# Patient Record
Sex: Male | Born: 1970 | Race: White | Hispanic: No | Marital: Married | Smoking: Current every day smoker
Health system: Southern US, Community
[De-identification: ages and names within clinical notes are randomized; demographics above are authoritative.]

## PROBLEM LIST (undated history)

## (undated) DIAGNOSIS — F329 Major depressive disorder, single episode, unspecified: Secondary | ICD-10-CM

## (undated) DIAGNOSIS — F419 Anxiety disorder, unspecified: Secondary | ICD-10-CM

## (undated) DIAGNOSIS — M549 Dorsalgia, unspecified: Secondary | ICD-10-CM

## (undated) DIAGNOSIS — F32A Depression, unspecified: Secondary | ICD-10-CM

## (undated) DIAGNOSIS — K219 Gastro-esophageal reflux disease without esophagitis: Secondary | ICD-10-CM

## (undated) HISTORY — DX: Gastro-esophageal reflux disease without esophagitis: K21.9

## (undated) HISTORY — DX: Major depressive disorder, single episode, unspecified: F32.9

## (undated) HISTORY — DX: Depression, unspecified: F32.A

## (undated) HISTORY — DX: Anxiety disorder, unspecified: F41.9

---

## 2007-11-07 ENCOUNTER — Emergency Department (HOSPITAL_COMMUNITY): Admission: EM | Admit: 2007-11-07 | Discharge: 2007-11-07 | Payer: Self-pay | Admitting: Emergency Medicine

## 2015-06-21 ENCOUNTER — Other Ambulatory Visit: Payer: Self-pay | Admitting: Physician Assistant

## 2015-06-21 LAB — CBC WITH DIFFERENTIAL/PLATELET
BASOS ABS: 0 10*3/uL (ref 0.0–0.1)
BASOS PCT: 0 % (ref 0–1)
EOS ABS: 0.3 10*3/uL (ref 0.0–0.7)
EOS PCT: 4 % (ref 0–5)
HCT: 43.5 % (ref 39.0–52.0)
Hemoglobin: 15.4 g/dL (ref 13.0–17.0)
LYMPHS ABS: 2.5 10*3/uL (ref 0.7–4.0)
Lymphocytes Relative: 36 % (ref 12–46)
MCH: 32.6 pg (ref 26.0–34.0)
MCHC: 35.4 g/dL (ref 30.0–36.0)
MCV: 92.2 fL (ref 78.0–100.0)
MPV: 11 fL (ref 8.6–12.4)
Monocytes Absolute: 0.6 10*3/uL (ref 0.1–1.0)
Monocytes Relative: 9 % (ref 3–12)
Neutro Abs: 3.6 10*3/uL (ref 1.7–7.7)
Neutrophils Relative %: 51 % (ref 43–77)
PLATELETS: 172 10*3/uL (ref 150–400)
RBC: 4.72 MIL/uL (ref 4.22–5.81)
RDW: 14.2 % (ref 11.5–15.5)
WBC: 7 10*3/uL (ref 4.0–10.5)

## 2015-06-21 LAB — LIPID PANEL
CHOLESTEROL: 220 mg/dL — AB (ref 125–200)
HDL: 32 mg/dL — AB (ref >=40)
LDL CALC: 134 mg/dL — AB (ref <130)
TRIGLYCERIDES: 269 mg/dL — AB (ref <150)
Total CHOL/HDL Ratio: 6.9 Ratio — ABNORMAL HIGH (ref <=5.0)
VLDL: 54 mg/dL — ABNORMAL HIGH (ref <30)

## 2015-06-21 LAB — COMPREHENSIVE METABOLIC PANEL
ALBUMIN: 4.7 g/dL (ref 3.6–5.1)
ALT: 28 U/L (ref 9–46)
AST: 20 U/L (ref 10–40)
Alkaline Phosphatase: 54 U/L (ref 40–115)
BILIRUBIN TOTAL: 0.6 mg/dL (ref 0.2–1.2)
BUN: 11 mg/dL (ref 7–25)
CALCIUM: 9.4 mg/dL (ref 8.6–10.3)
CO2: 29 mmol/L (ref 20–31)
CREATININE: 1.16 mg/dL (ref 0.60–1.35)
Chloride: 104 mmol/L (ref 98–110)
Glucose, Bld: 89 mg/dL (ref 65–99)
Potassium: 4.5 mmol/L (ref 3.5–5.3)
Sodium: 140 mmol/L (ref 135–146)
TOTAL PROTEIN: 7 g/dL (ref 6.1–8.1)

## 2015-06-21 LAB — HEMOGLOBIN A1C
HEMOGLOBIN A1C: 5.9 % — AB (ref <5.7)
MEAN PLASMA GLUCOSE: 123 mg/dL — AB (ref <117)

## 2015-06-21 LAB — TSH: TSH: 4.534 u[IU]/mL — AB (ref 0.350–4.500)

## 2015-06-21 LAB — AMYLASE: Amylase: 21 U/L (ref 0–105)

## 2015-06-21 LAB — LIPASE: Lipase: 13 U/L (ref 7–60)

## 2015-07-17 ENCOUNTER — Ambulatory Visit: Payer: Self-pay | Admitting: Physician Assistant

## 2015-07-17 ENCOUNTER — Encounter: Payer: Self-pay | Admitting: Physician Assistant

## 2015-07-17 VITALS — BP 106/74 | HR 66 | Temp 99.1°F | Ht 73.25 in | Wt 202.0 lb

## 2015-07-17 DIAGNOSIS — R1084 Generalized abdominal pain: Secondary | ICD-10-CM

## 2015-07-17 DIAGNOSIS — E785 Hyperlipidemia, unspecified: Secondary | ICD-10-CM

## 2015-07-17 MED ORDER — LOVASTATIN 20 MG PO TABS: 20.0000 mg | ORAL_TABLET | Freq: Every day | ORAL | Status: DC

## 2015-07-17 NOTE — Patient Instructions (Addendum)
Fat and Cholesterol Restricted Diet High levels of fat and cholesterol in your blood may lead to various health problems, such as diseases of the heart, blood vessels, gallbladder, liver, and pancreas. Fats are concentrated sources of energy that come in various forms. Certain types of fat, including saturated fat, may be harmful in excess. Cholesterol is a substance needed by your body in small amounts. Your body makes all the cholesterol it needs. Excess cholesterol comes from the food you eat. When you have high levels of cholesterol and saturated fat in your blood, health problems can develop because the excess fat and cholesterol will gather along the walls of your blood vessels, causing them to narrow. Choosing the right foods will help you control your intake of fat and cholesterol. This will help keep the levels of these substances in your blood within normal limits and reduce your risk of disease. WHAT IS MY PLAN? Your health care provider recommends that you:  Get no more than __________ % of the total calories in your daily diet from fat.  Limit your intake of saturated fat to less than ______% of your total calories each day.  Limit the amount of cholesterol in your diet to less than _________mg per day. WHAT TYPES OF FAT SHOULD I CHOOSE?  Choose healthy fats more often. Choose monounsaturated and polyunsaturated fats, such as olive and canola oil, flaxseeds, walnuts, almonds, and seeds.  Eat more omega-3 fats. Good choices include salmon, mackerel, sardines, tuna, flaxseed oil, and ground flaxseeds. Aim to eat fish at least two times a week.  Limit saturated fats. Saturated fats are primarily found in animal products, such as meats, butter, and cream. Plant sources of saturated fats include palm oil, palm kernel oil, and coconut oil.  Avoid foods with partially hydrogenated oils in them. These contain trans fats. Examples of foods that contain trans fats are stick margarine, some tub  margarines, cookies, crackers, and other baked goods. WHAT GENERAL GUIDELINES DO I NEED TO FOLLOW? These guidelines for healthy eating will help you control your intake of fat and cholesterol:  Check food labels carefully to identify foods with trans fats or high amounts of saturated fat.  Fill one half of your plate with vegetables and green salads.  Fill one fourth of your plate with whole grains. Look for the word "whole" as the first word in the ingredient list.  Fill one fourth of your plate with lean protein foods.  Limit fruit to two servings a day. Choose fruit instead of juice.  Eat more foods that contain soluble fiber. Examples of foods that contain this type of fiber are apples, broccoli, carrots, beans, peas, and barley. Aim to get 20-30 g of fiber per day.  Eat more home-cooked food and less restaurant, buffet, and fast food.  Limit or avoid alcohol.  Limit foods high in starch and sugar.  Limit fried foods.  Cook foods using methods other than frying. Baking, boiling, grilling, and broiling are all great options.  Lose weight if you are overweight. Losing just 5-10% of your initial body weight can help your overall health and prevent diseases such as diabetes and heart disease. WHAT FOODS CAN I EAT? Grains Whole grains, such as whole wheat or whole grain breads, crackers, cereals, and pasta. Unsweetened oatmeal, bulgur, barley, quinoa, or brown rice. Corn or whole wheat flour tortillas. Vegetables Fresh or frozen vegetables (raw, steamed, roasted, or grilled). Green salads. Fruits All fresh, canned (in natural juice), or frozen fruits. Meat and  Other Protein Products Ground beef (85% or leaner), grass-fed beef, or beef trimmed of fat. Skinless chicken or [place]. Ground chicken or [place]. Pork trimmed of fat. All fish and seafood. Eggs. Dried beans, peas, or lentils. Unsalted nuts or seeds. Unsalted canned or dry beans. Dairy Low-fat dairy products, such as skim or  1% milk, 2% or reduced-fat cheeses, low-fat ricotta or cottage cheese, or plain low-fat yogurt. Fats and Oils Tub margarines without trans fats. Light or reduced-fat mayonnaise and salad dressings. Avocado. Olive, canola, sesame, or safflower oils. Natural peanut or almond butter (choose ones without added sugar and oil). The items listed above may not be a complete list of recommended foods or beverages. Contact your dietitian for more options. WHAT FOODS ARE NOT RECOMMENDED? Grains White bread. White pasta. White rice. Cornbread. Bagels, pastries, and croissants. Crackers that contain trans fat. Vegetables White potatoes. Corn. Creamed or fried vegetables. Vegetables in a cheese sauce. Fruits Dried fruits. Canned fruit in light or heavy syrup. Fruit juice. Meat and Other Protein Products Fatty cuts of meat. Ribs, chicken wings, bacon, sausage, bologna, salami, chitterlings, fatback, hot dogs, bratwurst, and packaged luncheon meats. Liver and organ meats. Dairy Whole or 2% milk, cream, half-and-half, and cream cheese. Whole milk cheeses. Whole-fat or sweetened yogurt. Full-fat cheeses. Nondairy creamers and whipped toppings. Processed cheese, cheese spreads, or cheese curds. Sweets and Desserts Corn syrup, sugars, honey, and molasses. Candy. Jam and jelly. Syrup. Sweetened cereals. Cookies, pies, cakes, donuts, muffins, and ice cream. Fats and Oils Butter, stick margarine, lard, shortening, ghee, or bacon fat. Coconut, palm kernel, or palm oils. Beverages Alcohol. Sweetened drinks (such as sodas, lemonade, and fruit drinks or punches). The items listed above may not be a complete list of foods and beverages to avoid. Contact your dietitian for more information.   This information is not intended to replace advice given to you by your health care provider. Make sure you discuss any questions you have with your health care provider.   Document Released: 09/07/2005 Document Revised: 09/28/2014  Document Reviewed: 12/06/2013 Elsevier Interactive Patient Education 2016 [employer] Constipation, Adult Constipation is when a person has fewer than three bowel movements a week, has difficulty having a bowel movement, or has stools that are dry, hard, or larger than normal. As people grow older, constipation is more common. A low-fiber diet, not taking in enough fluids, and taking certain medicines may make constipation worse.  CAUSES   Certain medicines, such as antidepressants, pain medicine, iron supplements, antacids, and water pills.   Certain diseases, such as diabetes, irritable bowel syndrome (IBS), thyroid disease, or depression.   Not drinking enough water.   Not eating enough fiber-rich foods.   Stress or travel.   Lack of physical activity or exercise.   Ignoring the urge to have a bowel movement.   Using laxatives too much.  SIGNS AND SYMPTOMS   Having fewer than three bowel movements a week.   Straining to have a bowel movement.   Having stools that are hard, dry, or larger than normal.   Feeling full or bloated.   Pain in the lower abdomen.   Not feeling relief after having a bowel movement.  DIAGNOSIS  Your health care provider will take a medical history and perform a physical exam. Further testing may be done for severe constipation. Some tests may include:  A barium enema X-ray to examine your rectum, colon, and, sometimes, your small intestine.   A sigmoidoscopy to examine your lower colon.  A colonoscopy to examine your entire colon. TREATMENT  Treatment will depend on the severity of your constipation and what is causing it. Some dietary treatments include drinking more fluids and eating more fiber-rich foods. Lifestyle treatments may include regular exercise. If these diet and lifestyle recommendations do not help, your health care provider may recommend taking over-the-counter laxative medicines to help you have bowel  movements. Prescription medicines may be prescribed if over-the-counter medicines do not work.  HOME CARE INSTRUCTIONS   Eat foods that have a lot of fiber, such as fruits, vegetables, whole grains, and beans.  Limit foods high in fat and processed sugars, such as french fries, hamburgers, cookies, candies, and soda.   A fiber supplement may be added to your diet if you cannot get enough fiber from foods.   Drink enough fluids to keep your urine clear or pale yellow.   Exercise regularly or as directed by your health care provider.   Go to the restroom when you have the urge to go. Do not hold it.   Only take over-the-counter or prescription medicines as directed by your health care provider. Do not take other medicines for constipation without talking to your health care provider first.  SEEK IMMEDIATE MEDICAL CARE IF:   You have bright red blood in your stool.   Your constipation lasts for more than 4 days or gets worse.   You have abdominal or rectal pain.   You have thin, pencil-like stools.   You have unexplained weight loss. MAKE SURE YOU:   Understand these instructions.  Will watch your condition.  Will get help right away if you are not doing well or get worse.   This information is not intended to replace advice given to you by your health care provider. Make sure you discuss any questions you have with your health care provider.   Document Released: 06/05/2004 Document Revised: 09/28/2014 Document Reviewed: 06/19/2013 Elsevier Interactive Patient Education [employer]

## 2015-07-17 NOTE — Progress Notes (Signed)
Ht 6' 1.25" (1.861 m)  Wt 202 lb (91.627 kg)  BMI 26.46 kg/m2   Subjective:    Patient ID: Jonathan Booth, male    DOB: [birth year], 44 y.o.   MRN: [id number]  HPI: Jonathan Booth is a 44 y.o. male presenting on 07/17/2015 for Abdominal Pain   HPI   Pt poor historian.  States abd pain for over a year.  States somewhat improved since starting protonix but still hurts. States BM usually long time in between- he says sometimes a month.  His last BM was 3 days ago.  Pt is taking a med for depression but he doesn't know what it is called. He [facility] to [facility].     Relevant past medical, surgical, family and social history reviewed and updated as indicated. Interim medical history since our last visit reviewed. Allergies and medications reviewed and updated.   Current outpatient prescriptions:  .  pantoprazole (PROTONIX) 40 MG tablet, Take 40 mg by mouth 2 (two) times daily., Disp: , Rfl:  .  lovastatin (MEVACOR) 20 MG tablet, Take 1 tablet (20 mg total) by mouth at bedtime., Disp: 30 tablet, Rfl: 3   Review of Systems  Constitutional: Positive for diaphoresis. Negative for fever, chills, appetite change, fatigue and unexpected weight change.  HENT: Positive for dental problem. Negative for congestion, drooling, ear pain, facial swelling, hearing loss, mouth sores, sneezing, sore throat, trouble swallowing and voice change.   Eyes: Negative for pain, discharge, redness, itching and visual disturbance.  Respiratory: Positive for shortness of breath and wheezing. Negative for cough and choking.   Cardiovascular: Positive for chest pain. Negative for palpitations and leg swelling.  Gastrointestinal: Positive for abdominal pain. Negative for vomiting, diarrhea, constipation and blood in stool.  Endocrine: Positive for polydipsia. Negative for cold intolerance and heat intolerance.  Genitourinary: Negative for dysuria, hematuria and decreased urine volume.  Musculoskeletal: Positive  for back pain, arthralgias and gait problem.  Skin: Negative for rash.  Allergic/Immunologic: Negative for environmental allergies.  Neurological: Positive for light-headedness. Negative for seizures, syncope and headaches.  Hematological: Negative for adenopathy.  Psychiatric/Behavioral: Positive for dysphoric mood. Negative for suicidal ideas and agitation. The patient is nervous/anxious.     Per HPI unless specifically indicated above     Objective:    Ht 6' 1.25" (1.861 m)  Wt 202 lb (91.627 kg)  BMI 26.46 kg/m2  Wt Readings from Last 3 Encounters:  07/17/15 202 lb (91.627 kg)    Physical Exam  Results for orders placed or performed in visit on 06/21/15  CBC with Differential/Platelet  Result Value Ref Range   WBC 7.0 4.0 - 10.5 K/uL   RBC 4.72 4.22 - 5.81 MIL/uL   Hemoglobin 15.4 13.0 - 17.0 g/dL   HCT 43.5 39.0 - 52.0 %   MCV 92.2 78.0 - 100.0 fL   MCH 32.6 26.0 - 34.0 pg   MCHC 35.4 30.0 - 36.0 g/dL   RDW 14.2 11.5 - 15.5 %   Platelets 172 150 - 400 K/uL   MPV 11.0 8.6 - 12.4 fL   Neutrophils Relative % 51 43 - 77 %   Neutro Abs 3.6 1.7 - 7.7 K/uL   Lymphocytes Relative 36 12 - 46 %   Lymphs Abs 2.5 0.7 - 4.0 K/uL   Monocytes Relative 9 3 - 12 %   Monocytes Absolute 0.6 0.1 - 1.0 K/uL   Eosinophils Relative 4 0 - 5 %   Eosinophils Absolute 0.3 0.0 -  0.7 K/uL   Basophils Relative 0 0 - 1 %   Basophils Absolute 0.0 0.0 - 0.1 K/uL   Smear Review Criteria for review not met   Comprehensive metabolic panel  Result Value Ref Range   Sodium 140 135 - 146 mmol/L   Potassium 4.5 3.5 - 5.3 mmol/L   Chloride 104 98 - 110 mmol/L   CO2 29 20 - 31 mmol/L   Glucose, Bld 89 65 - 99 mg/dL   BUN 11 7 - 25 mg/dL   Creat 1.16 0.60 - 1.35 mg/dL   Total Bilirubin 0.6 0.2 - 1.2 mg/dL   Alkaline Phosphatase 54 40 - 115 U/L   AST 20 10 - 40 U/L   ALT 28 9 - 46 U/L   Total Protein 7.0 6.1 - 8.1 g/dL   Albumin 4.7 3.6 - 5.1 g/dL   Calcium 9.4 8.6 - 10.3 mg/dL  Lipid panel   Result Value Ref Range   Cholesterol 220 (H) 125 - 200 mg/dL   Triglycerides 269 (H) <150 mg/dL   HDL 32 (L) >=40 mg/dL   Total CHOL/HDL Ratio 6.9 (H) <=5.0 Ratio   VLDL 54 (H) <30 mg/dL   LDL Cholesterol 134 (H) <130 mg/dL  Amylase  Result Value Ref Range   Amylase 21 0 - 105 U/L  Lipase  Result Value Ref Range   Lipase 13 7 - 60 U/L  TSH  Result Value Ref Range   TSH 4.534 (H) 0.350 - 4.500 uIU/mL  Hemoglobin A1c  Result Value Ref Range   Hgb A1c MFr Bld 5.9 (H) <5.7 %   Mean Plasma Glucose 123 (H) <117 mg/dL     fecal occult blood NEGATIVE   Assessment & Plan:   Encounter Diagnoses  Name Primary?  . Generalized abdominal pain Yes  . Hyperlipemia     -Reviewed labs with pt  -rx lovastatin for chol. Gave lowfat diet. -Ct abd for pain. Pt given [facility] discount application -Cont protonix. Gave handout on constipation and counseled to increase water and add fiber -F/u 1 mo. Pt reminded to bring all meds to every appt

## 2015-08-01 ENCOUNTER — Telehealth: Payer: Self-pay | Admitting: Student

## 2015-08-01 NOTE — Telephone Encounter (Signed)
Called pt to notify him of CT appt @ [facility]. Pt was informed to pick up contrast no later than Friday 08-02-15. Pt undersood.

## 2015-08-05 ENCOUNTER — Ambulatory Visit (HOSPITAL_COMMUNITY)
Admission: RE | Admit: 2015-08-05 | Discharge: 2015-08-05 | Disposition: A | Discharge status: Home / Self Care | Payer: Self-pay | Source: Ambulatory Visit | Attending: Physician Assistant | Admitting: Physician Assistant

## 2015-08-05 DIAGNOSIS — G8929 Other chronic pain: Secondary | ICD-10-CM | POA: Insufficient documentation

## 2015-08-05 DIAGNOSIS — R1011 Right upper quadrant pain: Secondary | ICD-10-CM | POA: Insufficient documentation

## 2015-08-05 MED ORDER — IOHEXOL 300 MG/ML  SOLN
100.0000 mL | Freq: Once | INTRAMUSCULAR | Status: AC | PRN
Start: 2015-08-05 — End: 2015-08-05
  Administered 2015-08-05: 100 mL via INTRAVENOUS

## 2015-08-19 ENCOUNTER — Ambulatory Visit: Payer: Self-pay | Admitting: Physician Assistant

## 2015-08-19 ENCOUNTER — Encounter: Payer: Self-pay | Admitting: Physician Assistant

## 2015-08-19 VITALS — BP 104/74 | HR 63 | Temp 98.1°F | Ht 73.25 in | Wt 202.5 lb

## 2015-08-19 DIAGNOSIS — F17210 Nicotine dependence, cigarettes, uncomplicated: Secondary | ICD-10-CM

## 2015-08-19 DIAGNOSIS — K219 Gastro-esophageal reflux disease without esophagitis: Secondary | ICD-10-CM | POA: Insufficient documentation

## 2015-08-19 MED ORDER — PANTOPRAZOLE SODIUM 40 MG PO TBEC: 40.0000 mg | DELAYED_RELEASE_TABLET | Freq: Every day | ORAL | Status: DC

## 2015-08-19 NOTE — Progress Notes (Signed)
BP 104/74 mmHg  Pulse 63  Temp(Src) 98.1 F (36.7 C)  Ht 6' 1.25" (1.861 m)  Wt 202 lb 8 oz (91.853 kg)  BMI 26.52 kg/m2  SpO2 99%   Subjective:    Patient ID: Jonathan Booth, male    DOB: [birth year], 44 y.o.   MRN: [id number]  HPI: Jonathan Booth is a 44 y.o. male presenting on 08/19/2015 for Abdominal Pain   HPI  Chief Complaint  Patient presents with  . Abdominal Pain    pt states he feels medicine is helping, but he is still hurting on the left part of his ribs. [facility] and wants to know the results.    Pt was only taking his protonix 1 qod instead of bid as prescribed. Pt was given coupon so rx of 60 tabs would run $14.  He continues to buy his cigarettes.  Pt states that his abd pain feels better when he is taking his medication.  Reviewed CT abd pelvis with pt- normal  Pt is continuing with [facility] for MH issues  Relevant past medical, surgical, family and social history reviewed and updated as indicated. Interim medical history since our last visit reviewed. Allergies and medications reviewed and updated.   Current outpatient prescriptions:  .  DULoxetine (CYMBALTA) 60 MG capsule, Take 60 mg by mouth daily., Disp: , Rfl:  .  lovastatin (MEVACOR) 20 MG tablet, Take 1 tablet (20 mg total) by mouth at bedtime., Disp: 30 tablet, Rfl: 3 .  pantoprazole (PROTONIX) 40 MG tablet, Take 40 mg by mouth 2 (two) times daily., Disp: , Rfl: - only taking qod   Review of Systems  Constitutional: Negative for fever, chills, diaphoresis, appetite change, fatigue and unexpected weight change.  HENT: Positive for dental problem. Negative for congestion, drooling, ear pain, facial swelling, hearing loss, mouth sores, sneezing, sore throat, trouble swallowing and voice change.   Eyes: Negative for pain, discharge, redness, itching and visual disturbance.  Respiratory: Positive for shortness of breath and wheezing. Negative for cough and choking.   Cardiovascular:  Negative for chest pain, palpitations and leg swelling.  Gastrointestinal: Positive for abdominal pain. Negative for vomiting, diarrhea, constipation and blood in stool.  Endocrine: Negative for cold intolerance, heat intolerance and polydipsia.  Genitourinary: Negative for dysuria, hematuria and decreased urine volume.  Musculoskeletal: Positive for back pain, arthralgias and gait problem.  Skin: Negative for rash.  Allergic/Immunologic: Negative for environmental allergies.  Neurological: Negative for seizures, syncope, light-headedness and headaches.  Hematological: Negative for adenopathy.  Psychiatric/Behavioral: Positive for dysphoric mood and agitation. Negative for suicidal ideas. The patient is nervous/anxious.     Per HPI unless specifically indicated above     Objective:    BP 104/74 mmHg  Pulse 63  Temp(Src) 98.1 F (36.7 C)  Ht 6' 1.25" (1.861 m)  Wt 202 lb 8 oz (91.853 kg)  BMI 26.52 kg/m2  SpO2 99%  Wt Readings from Last 3 Encounters:  08/19/15 202 lb 8 oz (91.853 kg)  07/17/15 202 lb (91.627 kg)    Physical Exam  Constitutional: He is oriented to person, place, and time. He appears well-developed and well-nourished.  HENT:  Head: Normocephalic and atraumatic.  Neck: Neck supple.  Cardiovascular: Normal rate and regular rhythm.   Pulmonary/Chest: Effort normal and breath sounds normal. He has no wheezes.  Abdominal: Soft. Bowel sounds are normal. He exhibits no distension and no mass. There is no hepatosplenomegaly. There is tenderness in the epigastric  area. There is no rebound, no guarding and no CVA tenderness.  Musculoskeletal: He exhibits no edema.  Lymphadenopathy:    He has no cervical adenopathy.  Neurological: He is alert and oriented to person, place, and time.  Skin: Skin is warm and dry.  Psychiatric: He has a normal mood and affect. His behavior is normal.  Vitals reviewed.       Assessment & Plan:   Encounter Diagnoses  Name Primary?  .  Gastroesophageal reflux disease, esophagitis presence not specified Yes  . Cigarette nicotine dependence, uncomplicated    Discussed with pt that we can refer him to GI if his abd pain fails to resolve, but that we have to try to improve his condition first and this requires that he take his medication.  Discussed with pt that he needs to consider how to best use his limited funds (buying his meds versus his cigarettes).  Reviewed with him that smoking worsens GERD by relaxing the sphincter muscle between the esophagus and the stomach.  Pt states he will take his protonix as prescribed.  Will f/u 1 mo to check his symptoms. Counseled on smoking cessation

## 2015-08-19 NOTE — Patient Instructions (Signed)
Smoking Cessation, Tips for Success If you are ready to quit smoking, congratulations! You have chosen to help yourself be healthier. Cigarettes bring nicotine, tar, carbon monoxide, and other irritants into your body. Your lungs, heart, and blood vessels will be able to work better without these poisons. There are many different ways to quit smoking. Nicotine gum, nicotine patches, a nicotine inhaler, or nicotine nasal spray can help with physical craving. Hypnosis, support groups, and medicines help break the habit of smoking. WHAT THINGS CAN I DO TO MAKE QUITTING EASIER?  Here are some tips to help you quit for good:  Pick a date when you will quit smoking completely. Tell all of your friends and family about your plan to quit on that date.  Do not try to slowly cut down on the number of cigarettes you are smoking. Pick a quit date and quit smoking completely starting on that day.  Throw away all cigarettes.   Clean and remove all ashtrays from your home, work, and car.  On a card, write down your reasons for quitting. Carry the card with you and read it when you get the urge to smoke.  Cleanse your body of nicotine. Drink enough water and fluids to keep your urine clear or pale yellow. Do this after quitting to flush the nicotine from your body.  Learn to predict your moods. Do not let a bad situation be your excuse to have a cigarette. Some situations in your life might tempt you into wanting a cigarette.  Never have "just one" cigarette. It leads to wanting another and another. Remind yourself of your decision to quit.  Change habits associated with smoking. If you smoked while driving or when feeling stressed, try other activities to replace smoking. Stand up when drinking your coffee. Brush your teeth after eating. Sit in a different chair when you read the paper. Avoid alcohol while trying to quit, and try to drink fewer caffeinated beverages. Alcohol and caffeine may urge you to  smoke.  Avoid foods and drinks that can trigger a desire to smoke, such as sugary or spicy foods and alcohol.  Ask people who smoke not to smoke around you.  Have something planned to do right after eating or having a cup of coffee. For example, plan to take a walk or exercise.  Try a relaxation exercise to calm you down and decrease your stress. Remember, you may be tense and nervous for the first 2 weeks after you quit, but this will pass.  Find new activities to keep your hands busy. Play with a pen, coin, or rubber band. Doodle or draw things on paper.  Brush your teeth right after eating. This will help cut down on the craving for the taste of tobacco after meals. You can also try mouthwash.   Use oral substitutes in place of cigarettes. Try using lemon drops, carrots, cinnamon sticks, or chewing gum. Keep them handy so they are available when you have the urge to smoke.  When you have the urge to smoke, try deep breathing.  Designate your home as a nonsmoking area.  If you are a heavy smoker, ask your health care provider about a prescription for nicotine chewing gum. It can ease your withdrawal from nicotine.  Reward yourself. Set aside the cigarette money you save and buy yourself something nice.  Look for support from others. Join a support group or smoking cessation program. Ask someone at home or at work to help you with your plan   to quit smoking.  Always ask yourself, "Do I need this cigarette or is this just a reflex?" Tell yourself, "Today, I choose not to smoke," or "I do not want to smoke." You are reminding yourself of your decision to quit.  Do not replace cigarette smoking with electronic cigarettes (commonly called e-cigarettes). The safety of e-cigarettes is unknown, and some may contain harmful chemicals.  If you relapse, do not give up! Plan ahead and think about what you will do the next time you get the urge to smoke. HOW WILL I FEEL WHEN I QUIT SMOKING? You  may have symptoms of withdrawal because your body is used to nicotine (the addictive substance in cigarettes). You may crave cigarettes, be irritable, feel very hungry, cough often, get headaches, or have difficulty concentrating. The withdrawal symptoms are only temporary. They are strongest when you first quit but will go away within 10-14 days. When withdrawal symptoms occur, stay in control. Think about your reasons for quitting. Remind yourself that these are signs that your body is healing and getting used to being without cigarettes. Remember that withdrawal symptoms are easier to treat than the major diseases that smoking can cause.  Even after the withdrawal is over, expect periodic urges to smoke. However, these cravings are generally short lived and will go away whether you smoke or not. Do not smoke! WHAT RESOURCES ARE AVAILABLE TO HELP ME QUIT SMOKING? Your health care provider can direct you to community resources or hospitals for support, which may include:  Group support.  Education.  Hypnosis.  Therapy.   This information is not intended to replace advice given to you by your health care provider. Make sure you discuss any questions you have with your health care provider.   Document Released: 06/05/2004 Document Revised: 09/28/2014 Document Reviewed: 02/23/2013 Elsevier Interactive Patient Education 2016 Elsevier Inc.  

## 2015-09-23 ENCOUNTER — Emergency Department (HOSPITAL_COMMUNITY)
Admission: EM | Admit: 2015-09-23 | Discharge: 2015-09-23 | Disposition: A | Discharge status: Home / Self Care | Payer: Self-pay | Attending: Emergency Medicine | Admitting: Emergency Medicine

## 2015-09-23 ENCOUNTER — Encounter (HOSPITAL_COMMUNITY): Payer: Self-pay | Admitting: Emergency Medicine

## 2015-09-23 DIAGNOSIS — M549 Dorsalgia, unspecified: Secondary | ICD-10-CM

## 2015-09-23 DIAGNOSIS — F329 Major depressive disorder, single episode, unspecified: Secondary | ICD-10-CM | POA: Insufficient documentation

## 2015-09-23 DIAGNOSIS — M545 Low back pain: Secondary | ICD-10-CM | POA: Insufficient documentation

## 2015-09-23 DIAGNOSIS — F17210 Nicotine dependence, cigarettes, uncomplicated: Secondary | ICD-10-CM | POA: Insufficient documentation

## 2015-09-23 DIAGNOSIS — G8929 Other chronic pain: Secondary | ICD-10-CM | POA: Insufficient documentation

## 2015-09-23 DIAGNOSIS — K219 Gastro-esophageal reflux disease without esophagitis: Secondary | ICD-10-CM | POA: Insufficient documentation

## 2015-09-23 DIAGNOSIS — Z79899 Other long term (current) drug therapy: Secondary | ICD-10-CM | POA: Insufficient documentation

## 2015-09-23 DIAGNOSIS — F419 Anxiety disorder, unspecified: Secondary | ICD-10-CM | POA: Insufficient documentation

## 2015-09-23 DIAGNOSIS — R202 Paresthesia of skin: Secondary | ICD-10-CM | POA: Insufficient documentation

## 2015-09-23 DIAGNOSIS — R109 Unspecified abdominal pain: Secondary | ICD-10-CM | POA: Insufficient documentation

## 2015-09-23 HISTORY — DX: Dorsalgia, unspecified: M54.9

## 2015-09-23 MED ORDER — TRAMADOL HCL 50 MG PO TABS
50.0000 mg | ORAL_TABLET | Freq: Once | ORAL | Status: AC
  Administered 2015-09-23: 50 mg via ORAL
  Filled 2015-09-23: qty 1

## 2015-09-23 MED ORDER — METHOCARBAMOL 500 MG PO TABS
500.0000 mg | ORAL_TABLET | Freq: Two times a day (BID) | ORAL | Status: DC
Start: 2015-09-23 — End: 2015-12-24

## 2015-09-23 MED ORDER — METHYLPREDNISOLONE 4 MG PO TBPK: ORAL_TABLET | ORAL | Status: DC

## 2015-09-23 MED ORDER — TRAMADOL HCL 50 MG PO TABS: 50.0000 mg | ORAL_TABLET | Freq: Four times a day (QID) | ORAL | Status: DC | PRN

## 2015-09-23 NOTE — ED Notes (Signed)
Patient complaining of back pain x 8 months and abdominal pain x 3 months. States he has been here for same.

## 2015-09-23 NOTE — Discharge Instructions (Signed)
Return to ER with weakness in on e leg, loss of control of your bowel or bladder. Keep your appointment at outpatient clinic.  Back Pain, Adult Back pain is very common. The pain often gets better over time. The cause of back pain is usually not dangerous. Most people can learn to manage their back pain on their own.  HOME CARE  Watch your back pain for any changes. The following actions may help to lessen any pain you are feeling: 1. Stay active. Start with short walks on flat ground if you can. Try to walk farther each day. 2. Exercise regularly as told by your doctor. Exercise helps your back heal faster. It also helps avoid future injury by keeping your muscles strong and flexible. 3. Do not sit, drive, or stand in one place for more than 30 minutes. 4. Do not stay in bed. Resting more than 1-2 days can slow down your recovery. 5. Be careful when you bend or lift an object. Use good form when lifting: 1. Bend at your knees. 2. Keep the object close to your body. 3. Do not twist. 6. Sleep on a firm mattress. Lie on your side, and bend your knees. If you lie on your back, put a pillow under your knees. 7. Take medicines only as told by your doctor. 8. Put ice on the injured area. 1. Put ice in a plastic bag. 2. Place a towel between your skin and the bag. 3. Leave the ice on for 20 minutes, 2-3 times a day for the first 2-3 days. After that, you can switch between ice and heat packs. 9. Avoid feeling anxious or stressed. Find good ways to deal with stress, such as exercise. 10. Maintain a healthy weight. Extra weight puts stress on your back. GET HELP IF:  1. You have pain that does not go away with rest or medicine. 2. You have worsening pain that goes down into your legs or buttocks. 3. You have pain that does not get better in one week. 4. You have pain at night. 5. You lose weight. 6. You have a fever or chills. GET HELP RIGHT AWAY IF:  1. You cannot control when you poop (bowel  movement) or pee (urinate). 2. Your arms or legs feel weak. 3. Your arms or legs lose feeling (numbness). 4. You feel sick to your stomach (nauseous) or throw up (vomit). 5. You have belly (abdominal) pain. 6. You feel like you may pass out (faint).   This information is not intended to replace advice given to you by your health care provider. Make sure you discuss any questions you have with your health care provider.   Document Released: 02/24/2008 Document Revised: 09/28/2014 Document Reviewed: 01/09/2014 Elsevier Interactive Patient Education 2016 Elsevier Inc.  Back Exercises If you have pain in your back, do these exercises 2-3 times each day or as told by your doctor. When the pain goes away, do the exercises once each day, but repeat the steps more times for each exercise (do more repetitions). If you do not have pain in your back, do these exercises once each day or as told by your doctor. EXERCISES Single Knee to Chest Do these steps 3-5 times in a row for each leg: 11. Lie on your back on a firm bed or the floor with your legs stretched out. 12. Bring one knee to your chest. 13. Hold your knee to your chest by grabbing your knee or thigh. 14. Pull on your knee  until you feel a gentle stretch in your lower back. 15. Keep doing the stretch for 10-30 seconds. 16. Slowly let go of your leg and straighten it. Pelvic Tilt Do these steps 5-10 times in a row: 7. Lie on your back on a firm bed or the floor with your legs stretched out. 8. Bend your knees so they point up to the ceiling. Your feet should be flat on the floor. 9. Tighten your lower belly (abdomen) muscles to press your lower back against the floor. This will make your tailbone point up to the ceiling instead of pointing down to your feet or the floor. 10. Stay in this position for 5-10 seconds while you gently tighten your muscles and breathe evenly. Cat-Cow Do these steps until your lower back bends more  easily: 7. Get on your hands and knees on a firm surface. Keep your hands under your shoulders, and keep your knees under your hips. You may put padding under your knees. 8. Let your head hang down, and make your tailbone point down to the floor so your lower back is round like the back of a cat. 9. Stay in this position for 5 seconds. 10. Slowly lift your head and make your tailbone point up to the ceiling so your back hangs low (sags) like the back of a cow. 11. Stay in this position for 5 seconds. Press-Ups Do these steps 5-10 times in a row: 1. Lie on your belly (face-down) on the floor. 2. Place your hands near your head, about shoulder-width apart. 3. While you keep your back relaxed and keep your hips on the floor, slowly straighten your arms to raise the top half of your body and lift your shoulders. Do not use your back muscles. To make yourself more comfortable, you may change where you place your hands. 4. Stay in this position for 5 seconds. 5. Slowly return to lying flat on the floor. Bridges Do these steps 10 times in a row: 1. Lie on your back on a firm surface. 2. Bend your knees so they point up to the ceiling. Your feet should be flat on the floor. 3. Tighten your butt muscles and lift your butt off of the floor until your waist is almost as high as your knees. If you do not feel the muscles working in your butt and the back of your thighs, slide your feet 1-2 inches farther away from your butt. 4. Stay in this position for 3-5 seconds. 5. Slowly lower your butt to the floor, and let your butt muscles relax. If this exercise is too easy, try doing it with your arms crossed over your chest. Belly Crunches Do these steps 5-10 times in a row: 1. Lie on your back on a firm bed or the floor with your legs stretched out. 2. Bend your knees so they point up to the ceiling. Your feet should be flat on the floor. 3. Cross your arms over your chest. 4. Tip your chin a little bit  toward your chest but do not bend your neck. 5. Tighten your belly muscles and slowly raise your chest just enough to lift your shoulder blades a tiny bit off of the floor. 6. Slowly lower your chest and your head to the floor. Back Lifts Do these steps 5-10 times in a row: 1. Lie on your belly (face-down) with your arms at your sides, and rest your forehead on the floor. 2. Tighten the muscles in your legs and your  butt. 3. Slowly lift your chest off of the floor while you keep your hips on the floor. Keep the back of your head in line with the curve in your back. Look at the floor while you do this. 4. Stay in this position for 3-5 seconds. 5. Slowly lower your chest and your face to the floor. GET HELP IF:  Your back pain gets a lot worse when you do an exercise.  Your back pain does not lessen 2 hours after you exercise. If you have any of these problems, stop doing the exercises. Do not do them again unless your doctor says it is okay. GET HELP RIGHT AWAY IF:  You have sudden, very bad back pain. If this happens, stop doing the exercises. Do not do them again unless your doctor says it is okay.   This information is not intended to replace advice given to you by your health care provider. Make sure you discuss any questions you have with your health care provider.   Document Released: 10/10/2010 Document Revised: 05/29/2015 Document Reviewed: 11/01/2014 Elsevier Interactive Patient Education [employer].

## 2015-09-23 NOTE — ED Provider Notes (Addendum)
CSN: [id number]     Arrival date & time 09/23/15  1933 History  By signing my name below, I, Soijett Blue, attest that this documentation has been prepared under the direction and in the presence of Rolland Porter, MD. Electronically Signed: Soijett Blue, ED Scribe. 09/23/2015. 11:22 PM.   Chief Complaint  Patient presents with  . Abdominal Pain  . Back Pain      The history is provided by the patient. No language interpreter was used.    HPI Comments: Jonathan Booth is a 45 y.o. male with a medical hx of back pain who presents to the Emergency Department complaining of bilateral lower back pain 7-8 years ago worsening recently. He reports that he initially injured his back due to heavy lifting and that he is now he is unable to lift anything over 10 lbs without an increase in his pain and numbness to his arm as well as the back pain radiating down his bilateral legs. He describes his back pain as a squeezing sensation. He states that he has been seen at [facility] for his symptoms in the past. 8 months. He states that he has not tried any medications for the relief for his symptoms. Pt denies numbness to his legs, weakness, and any other symptoms. Denies allergies to medications. He states that he has an appointment in the morning at [facility] to be seen at their free clinic.    Past Medical History  Diagnosis Date  . Depression   . Anxiety   . GERD (gastroesophageal reflux disease)   . Back pain    History reviewed. No pertinent past surgical history. Family History  Problem Relation Age of Onset  . Heart disease Father   . Hypertension Father    Social History  Substance Use Topics  . Smoking status: Current Every Day Smoker -- 1.00 packs/day for 30 years    Types: Cigarettes  . Smokeless tobacco: Never Used  . Alcohol Use: No    Review of Systems  Constitutional: Negative for fever, chills, diaphoresis, appetite change and fatigue.  HENT: Negative for mouth sores, sore throat and  trouble swallowing.   Eyes: Negative for visual disturbance.  Respiratory: Negative for cough, chest tightness, shortness of breath and wheezing.   Cardiovascular: Negative for chest pain.  Gastrointestinal: Positive for abdominal pain. Negative for nausea, vomiting, diarrhea and abdominal distention.  Endocrine: Negative for polydipsia, polyphagia and polyuria.  Genitourinary: Negative for dysuria, frequency and hematuria.  Musculoskeletal: Positive for back pain. Negative for gait problem.  Skin: Negative for color change, pallor and rash.  Neurological: Positive for numbness. Negative for dizziness, syncope, weakness, light-headedness and headaches.  Hematological: Does not bruise/bleed easily.  Psychiatric/Behavioral: Negative for behavioral problems and confusion.      Allergies  Review of patient's allergies indicates no known allergies.  Home Medications   Prior to Admission medications   Medication Sig Start Date End Date Taking? Authorizing Provider  DULoxetine (CYMBALTA) 60 MG capsule Take 60 mg by mouth daily.   Yes Historical Provider, MD  lovastatin (MEVACOR) 20 MG tablet Take 1 tablet (20 mg total) by mouth at bedtime. 07/17/15  Yes Jacquelin Hawking, PA-C  pantoprazole (PROTONIX) 40 MG tablet Take 1 tablet (40 mg total) by mouth daily. 08/19/15  Yes Jacquelin Hawking, PA-C  methocarbamol (ROBAXIN) 500 MG tablet Take 1 tablet (500 mg total) by mouth 2 (two) times daily. 09/23/15   Rolland Porter, MD  methylPREDNISolone (MEDROL DOSEPAK) 4 MG TBPK tablet 6 po  on day 1, then decrease by 1 per day 09/23/15   Rolland PorterMark Luis Nickles, MD  traMADol (ULTRAM) 50 MG tablet Take 1 tablet (50 mg total) by mouth every 6 (six) hours as needed. 09/23/15   Rolland PorterMark Aydin Cavalieri, MD   BP 127/78 mmHg  Pulse 62  Temp(Src) 98.1 F (36.7 C) (Oral)  Resp 18  Ht 6\' 1"  (1.854 m)  Wt 203 lb (92.08 kg)  BMI 26.79 kg/m2  SpO2 94% Physical Exam  Constitutional: He is oriented to person, place, and time. He appears  well-developed and well-nourished. No distress.  HENT:  Head: Normocephalic.  Eyes: Conjunctivae are normal. Pupils are equal, round, and reactive to light. No scleral icterus.  Neck: Normal range of motion. Neck supple. No thyromegaly present.  Cardiovascular: Normal rate and regular rhythm.  Exam reveals no gallop and no friction rub.   No murmur heard. Pulmonary/Chest: Effort normal and breath sounds normal. No respiratory distress. He has no wheezes. He has no rales.  Abdominal: Soft. Bowel sounds are normal. He exhibits no distension. There is no tenderness. There is no rebound.  Musculoskeletal: Normal range of motion.  Neurological: He is alert and oriented to person, place, and time.  Normal symmetric Strength to shoulder shrug, triceps, biceps, grip,wrist flex/extend,and intrinsics  Norma lsymmetric sensation above and below clavicles, and to all distributions to UEs. Norma symmetric strength to flex/.extend hip and knees, dorsi/plantar flex ankles. Normal symmetric sensation to all distributions to LEs Patellar and achilles reflexes 1-2+. Downgoing Babinski   Skin: Skin is warm and dry. No rash noted.  Psychiatric: He has a normal mood and affect. His behavior is normal.  Nursing note and vitals reviewed.   ED Course  Procedures (including critical care time) DIAGNOSTIC STUDIES: Oxygen Saturation is 94% on RA, adequate by my interpretation.    COORDINATION OF CARE: 11:22 PM Discussed treatment plan with pt at bedside and pt agreed to plan.    Labs Review Labs Reviewed - No data to display  Imaging Review No results found.    EKG Interpretation None      MDM   Final diagnoses:  Chronic back pain    No signs/symptoms of acute HNP.  Has am clinic appointment.  I personally performed the services described in this documentation, which was scribed in my presence. The recorded information has been reviewed and is accurate.   Rolland PorterMark Elery Cadenhead, MD 09/23/15  2332  Rolland PorterMark Vania Rosero, MD 09/23/15 2350

## 2015-09-23 NOTE — ED Notes (Signed)
Pt states understanding of care given and follow up instructions 

## 2015-09-24 ENCOUNTER — Ambulatory Visit: Payer: Self-pay | Admitting: Physician Assistant

## 2015-09-24 ENCOUNTER — Encounter: Payer: Self-pay | Admitting: Physician Assistant

## 2015-09-24 VITALS — BP 118/76 | HR 56 | Temp 98.1°F | Ht 71.25 in | Wt 204.0 lb

## 2015-09-24 DIAGNOSIS — E785 Hyperlipidemia, unspecified: Secondary | ICD-10-CM

## 2015-09-24 DIAGNOSIS — M549 Dorsalgia, unspecified: Secondary | ICD-10-CM

## 2015-09-24 DIAGNOSIS — K219 Gastro-esophageal reflux disease without esophagitis: Secondary | ICD-10-CM

## 2015-09-24 DIAGNOSIS — Z91199 Patient's noncompliance with other medical treatment and regimen due to unspecified reason: Secondary | ICD-10-CM

## 2015-09-24 DIAGNOSIS — G8929 Other chronic pain: Secondary | ICD-10-CM

## 2015-09-24 DIAGNOSIS — Z9119 Patient's noncompliance with other medical treatment and regimen: Secondary | ICD-10-CM

## 2015-09-24 NOTE — Progress Notes (Signed)
BP 118/76 mmHg  Pulse 56  Temp(Src) 98.1 F (36.7 C)  Ht 5' 11.25" (1.81 m)  Wt 204 lb (92.534 kg)  BMI 28.25 kg/m2  SpO2 98%   Subjective:    Patient ID: Jonathan Booth, male    DOB: 1970/10/01, 45 y.o.   MRN: [id number]  HPI: Jonathan Booth is a 45 y.o. male presenting on 09/24/2015 for Gastroesophageal Reflux   HPI   Chief Complaint  Patient presents with  . Gastroesophageal Reflux   [facility], given rx's for pain and muscle relaxer, has not yet had them filled  Pt is still not taking his proton pump inhibitor  Relevant past medical, surgical, family and social history reviewed and updated as indicated. Interim medical history since our last visit reviewed.  Current outpatient prescriptions:  .  FLUoxetine (PROZAC) 10 MG capsule, Take 20 mg by mouth daily., Disp: , Rfl:  .  DULoxetine (CYMBALTA) 60 MG capsule, Take 60 mg by mouth daily. Reported on 09/24/2015, Disp: , Rfl:  .  lovastatin (MEVACOR) 20 MG tablet, Take 1 tablet (20 mg total) by mouth at bedtime. (Patient not taking: Reported on 09/24/2015), Disp: 30 tablet, Rfl: 3 .  methocarbamol (ROBAXIN) 500 MG tablet, Take 1 tablet (500 mg total) by mouth 2 (two) times daily. (Patient not taking: Reported on 09/24/2015), Disp: 20 tablet, Rfl: 0 .  methylPREDNISolone (MEDROL DOSEPAK) 4 MG TBPK tablet, 6 po on day 1, then decrease by 1 per day (Patient not taking: Reported on 09/24/2015), Disp: 21 tablet, Rfl: 0 .  pantoprazole (PROTONIX) 40 MG tablet, Take 1 tablet (40 mg total) by mouth daily. (Patient not taking: Reported on 09/24/2015), Disp: 60 tablet, Rfl: 3 .  traMADol (ULTRAM) 50 MG tablet, Take 1 tablet (50 mg total) by mouth every 6 (six) hours as needed. (Patient not taking: Reported on 09/24/2015), Disp: 15 tablet, Rfl: 0   Review of Systems  Constitutional: Negative for fever, chills, diaphoresis, appetite change, fatigue and unexpected weight change.  HENT: Positive for dental problem. Negative for  congestion, drooling, ear pain, facial swelling, hearing loss, mouth sores, sneezing, sore throat, trouble swallowing and voice change.   Eyes: Negative for pain, discharge, redness, itching and visual disturbance.  Respiratory: Positive for cough and wheezing. Negative for choking and shortness of breath.   Cardiovascular: Negative for chest pain, palpitations and leg swelling.  Gastrointestinal: Negative for vomiting, abdominal pain, diarrhea, constipation and blood in stool.  Endocrine: Negative for cold intolerance, heat intolerance and polydipsia.  Genitourinary: Negative for dysuria, hematuria and decreased urine volume.  Musculoskeletal: Positive for back pain, arthralgias and gait problem.  Skin: Negative for rash.  Allergic/Immunologic: Negative for environmental allergies.  Neurological: Negative for seizures, syncope, light-headedness and headaches.  Hematological: Negative for adenopathy.  Psychiatric/Behavioral: Positive for dysphoric mood. Negative for suicidal ideas and agitation. The patient is nervous/anxious.     Per HPI unless specifically indicated above     Objective:    BP 118/76 mmHg  Pulse 56  Temp(Src) 98.1 F (36.7 C)  Ht 5' 11.25" (1.81 m)  Wt 204 lb (92.534 kg)  BMI 28.25 kg/m2  SpO2 98%  Wt Readings from Last 3 Encounters:  09/24/15 204 lb (92.534 kg)  09/23/15 203 lb (92.08 kg)  08/19/15 202 lb 8 oz (91.853 kg)    Physical Exam  Constitutional: He is oriented to person, place, and time. He appears well-developed and well-nourished.  HENT:  Head: Normocephalic and  atraumatic.  Neck: Neck supple.  Cardiovascular: Normal rate and regular rhythm.   Pulmonary/Chest: Effort normal and breath sounds normal. He has no wheezes.  Abdominal: Soft. Bowel sounds are normal. He exhibits no distension. There is no hepatosplenomegaly. There is no tenderness.  Lymphadenopathy:    He has no cervical adenopathy.  Neurological: He is alert and oriented to person,  place, and time.  Skin: Skin is warm and dry.  Psychiatric: He has a normal mood and affect. His behavior is normal.  Vitals reviewed.       Assessment & Plan:   Encounter Diagnoses  Name Primary?  . Hyperlipidemia Yes  . Gastroesophageal reflux disease, esophagitis presence not specified   . Personal history of noncompliance with medical treatment, presenting hazards to health   . Chronic back pain     Check fasting labs this week.  Will call with results Recommend pt  get back on his protonix F/u 3 mo. RTO sooner for worsening pain or if new symptoms develop

## 2015-09-29 DIAGNOSIS — M549 Dorsalgia, unspecified: Secondary | ICD-10-CM

## 2015-09-29 DIAGNOSIS — E785 Hyperlipidemia, unspecified: Secondary | ICD-10-CM | POA: Insufficient documentation

## 2015-09-29 DIAGNOSIS — G8929 Other chronic pain: Secondary | ICD-10-CM | POA: Insufficient documentation

## 2015-11-25 ENCOUNTER — Emergency Department (HOSPITAL_COMMUNITY)
Admission: EM | Admit: 2015-11-25 | Discharge: 2015-11-25 | Disposition: A | Discharge status: Home / Self Care | Payer: Self-pay | Attending: Emergency Medicine | Admitting: Emergency Medicine

## 2015-11-25 ENCOUNTER — Encounter (HOSPITAL_COMMUNITY): Payer: Self-pay | Admitting: *Deleted

## 2015-11-25 DIAGNOSIS — G8929 Other chronic pain: Secondary | ICD-10-CM | POA: Insufficient documentation

## 2015-11-25 DIAGNOSIS — Z79899 Other long term (current) drug therapy: Secondary | ICD-10-CM | POA: Insufficient documentation

## 2015-11-25 DIAGNOSIS — M545 Low back pain: Secondary | ICD-10-CM | POA: Insufficient documentation

## 2015-11-25 DIAGNOSIS — F17210 Nicotine dependence, cigarettes, uncomplicated: Secondary | ICD-10-CM | POA: Insufficient documentation

## 2015-11-25 DIAGNOSIS — M549 Dorsalgia, unspecified: Secondary | ICD-10-CM

## 2015-11-25 MED ORDER — PREDNISONE 50 MG PO TABS
60.0000 mg | ORAL_TABLET | Freq: Once | ORAL | Status: AC
  Administered 2015-11-25: 60 mg via ORAL
  Filled 2015-11-25: qty 1

## 2015-11-25 MED ORDER — ONDANSETRON HCL 4 MG PO TABS
4.0000 mg | ORAL_TABLET | Freq: Once | ORAL | Status: AC
  Administered 2015-11-25: 4 mg via ORAL
  Filled 2015-11-25: qty 1

## 2015-11-25 MED ORDER — METHOCARBAMOL 500 MG PO TABS
1000.0000 mg | ORAL_TABLET | Freq: Once | ORAL | Status: AC
  Administered 2015-11-25: 1000 mg via ORAL
  Filled 2015-11-25: qty 2

## 2015-11-25 MED ORDER — IBUPROFEN 800 MG PO TABS: 800.0000 mg | ORAL_TABLET | Freq: Three times a day (TID) | ORAL | Status: DC

## 2015-11-25 MED ORDER — DEXAMETHASONE 4 MG PO TABS: 4.0000 mg | ORAL_TABLET | Freq: Two times a day (BID) | ORAL | Status: DC

## 2015-11-25 MED ORDER — KETOROLAC TROMETHAMINE 10 MG PO TABS
10.0000 mg | ORAL_TABLET | Freq: Once | ORAL | Status: AC
  Administered 2015-11-25: 10 mg via ORAL
  Filled 2015-11-25: qty 1

## 2015-11-25 MED ORDER — BACLOFEN 10 MG PO TABS: 10.0000 mg | ORAL_TABLET | Freq: Three times a day (TID) | ORAL | Status: DC

## 2015-11-25 NOTE — ED Provider Notes (Signed)
CSN: [id number]     Arrival date & time 11/25/15  1305 History  By signing my name below, I, Jonathan Booth, attest that this documentation has been prepared under the direction and in the presence of Ivery Quale, PA-C Electronically Signed: Soijett Booth, ED Scribe. 11/25/2015. 4:35 PM.   Chief Complaint  Patient presents with  . Back Pain      Patient is a 45 y.o. male presenting with back pain. The history is provided by the patient. No language interpreter was used.  Back Pain Location:  Lumbar spine Quality:  Shooting Radiates to:  Does not radiate Pain severity:  Moderate Pain is:  Unable to specify Onset quality:  Gradual Duration: 2 years. Timing:  Constant Progression:  Worsening Chronicity:  Chronic Context: lifting heavy objects   Relieved by: applying pressure. Worsened by:  Movement Ineffective treatments:  None tried Associated symptoms: numbness   Associated symptoms: no fever, no tingling and no weakness     HPI Comments: Jonathan Booth is a 45 y.o. male with a medical hx of back pain who presents to the Emergency Department complaining of gradually worsening right lower back pain onset 2 years ago. Pt notes that he initially injured his back while trying to load up a trailer. He reports that the back pain does radiate to his RLE. Pt goes to [facility] for his symptoms. He states that he is having associated symptoms of numbness with increased movement of RLE and right hip. He states that he has not tried any medications for the relief for his symptoms. Pt denies gait problem, fever, color change, rash, wound, tingling, weakness, and any other symptoms. Denies allergies to medications.    Past Medical History  Diagnosis Date  . Depression   . Anxiety   . GERD (gastroesophageal reflux disease)   . Back pain    History reviewed. No pertinent past surgical history. Family History  Problem Relation Age of Onset  . Heart disease Father   . Hypertension Father     Social History  Substance Use Topics  . Smoking status: Current Every Day Smoker -- 1.00 packs/day for 30 years    Types: Cigarettes  . Smokeless tobacco: Never Used  . Alcohol Use: No    Review of Systems  Constitutional: Negative for fever.  Musculoskeletal: Positive for back pain. Negative for gait problem.  Skin: Negative for color change, rash and wound.  Neurological: Positive for numbness. Negative for tingling and weakness.  All other systems reviewed and are negative.     Allergies  Review of patient's allergies indicates no known allergies.  Home Medications   Prior to Admission medications   Medication Sig Start Date End Date Taking? Authorizing Provider  DULoxetine (CYMBALTA) 60 MG capsule Take 60 mg by mouth daily. Reported on 09/24/2015    Historical Provider, MD  FLUoxetine (PROZAC) 10 MG capsule Take 20 mg by mouth daily.    Historical Provider, MD  lovastatin (MEVACOR) 20 MG tablet Take 1 tablet (20 mg total) by mouth at bedtime. Patient not taking: Reported on 09/24/2015 07/17/15   Jacquelin Hawking, PA-C  methocarbamol (ROBAXIN) 500 MG tablet Take 1 tablet (500 mg total) by mouth 2 (two) times daily. Patient not taking: Reported on 09/24/2015 09/23/15   Rolland Porter, MD  methylPREDNISolone (MEDROL DOSEPAK) 4 MG TBPK tablet 6 po on day 1, then decrease by 1 per day Patient not taking: Reported on 09/24/2015 09/23/15   Rolland Porter, MD  pantoprazole (PROTONIX) 40 MG tablet  Take 1 tablet (40 mg total) by mouth daily. Patient not taking: Reported on 09/24/2015 08/19/15   Jacquelin HawkingShannon McElroy, PA-C  traMADol (ULTRAM) 50 MG tablet Take 1 tablet (50 mg total) by mouth every 6 (six) hours as needed. Patient not taking: Reported on 09/24/2015 09/23/15   Rolland PorterMark James, MD   BP 129/76 mmHg  Pulse 68  Temp(Src) 98.2 F (36.8 C) (Oral)  Resp 16  Ht 5\' 7"  (1.702 m)  Wt 200 lb (90.719 kg)  BMI 31.32 kg/m2  SpO2 97% Physical Exam  Constitutional: He is oriented to person, place, and time. He  appears well-developed and well-nourished. No distress.  HENT:  Head: Normocephalic and atraumatic.  Eyes: EOM are normal.  Neck: Neck supple. Carotid bruit is not present.  Cardiovascular: Normal rate, regular rhythm and normal heart sounds.  Exam reveals no gallop and no friction rub.   No murmur heard. Pulmonary/Chest: Effort normal and breath sounds normal. No respiratory distress. He has no wheezes. He has no rales.  Musculoskeletal: Normal range of motion.  Paraspinal area tenderness of the lumbar region. No palpable step-off or the lumbar or cervical spine.  Neurological: He is alert and oriented to person, place, and time. He has normal strength. No sensory deficit.  No motor or sensory deficit of the upper or lower extremities. Symmetrical shoulder shrug.   Skin: Skin is warm and dry.  Psychiatric: He has a normal mood and affect. His behavior is normal.  Nursing note and vitals reviewed.   ED Course  Procedures (including critical care time) DIAGNOSTIC STUDIES: Oxygen Saturation is 97% on RA, nl by my interpretation.    COORDINATION OF CARE: 4:35 PM Discussed treatment plan with pt at bedside which includes decadron PO, diclofenac PO, muscle relaxer Rx, and pt agreed to plan.    Labs Review Labs Reviewed - No data to display  Imaging Review No results found.    EKG Interpretation None      MDM I have reviewed previous ED visits and imaging. Exam today suggest exacerbation of chronic back pain. No gross neuro deficits noted. Pt given the name of the [facility] as an additional resource. [facility]. Rx for Baclofen, decadron, and ibuprofen 800 given to the patient. Pt acknowledges understanding of the d/c plan.   Final diagnoses:  None    **I have reviewed nursing notes, vital signs, and all appropriate lab and imaging results for this patient.*  **I personally performed the services described in this documentation, which was  scribed in my presence. The recorded information has been reviewed and is accurate.Ivery Quale*   Khushboo Chuck, PA-C 11/25/15 1650  Dione Boozeavid Glick, MD 11/25/15 [phone]

## 2015-11-25 NOTE — Discharge Instructions (Signed)
Your vital signs are within normal limits. Please apply heat to the neck and the lower back. Use medication as suggested. Baclofen may cause drowsiness, use with caution.. See [facility] at [facility] or the MD at the [facility] for assistance with MRI and orthopedic referral . Chronic Back Pain  When back pain lasts longer than 3 months, it is called chronic back pain.People with chronic back pain often go through certain periods that are more intense (flare-ups).  CAUSES Chronic back pain can be caused by wear and tear (degeneration) on different structures in your back. These structures include:  The bones of your spine (vertebrae) and the joints surrounding your spinal cord and nerve roots (facets).  The strong, fibrous tissues that connect your vertebrae (ligaments). Degeneration of these structures may result in pressure on your nerves. This can lead to constant pain. HOME CARE INSTRUCTIONS  Avoid bending, heavy lifting, prolonged sitting, and activities which make the problem worse.  Take brief periods of rest throughout the day to reduce your pain. Lying down or standing usually is better than sitting while you are resting.  Take over-the-counter or prescription medicines only as directed by your caregiver. SEEK IMMEDIATE MEDICAL CARE IF:   You have weakness or numbness in one of your legs or feet.  You have trouble controlling your bladder or bowels.  You have nausea, vomiting, abdominal pain, shortness of breath, or fainting.   This information is not intended to replace advice given to you by your health care provider. Make sure you discuss any questions you have with your health care provider.   Document Released: 10/15/2004 Document Revised: 11/30/2011 Document Reviewed: 02/25/2015 Elsevier Interactive Patient Education [employer]2016 Elsevier Inc.

## 2015-11-25 NOTE — ED Notes (Signed)
Pt comes in with back pain that is chronic.

## 2015-12-02 ENCOUNTER — Ambulatory Visit: Payer: Self-pay | Admitting: Physician Assistant

## 2015-12-05 ENCOUNTER — Ambulatory Visit: Payer: Self-pay | Admitting: Physician Assistant

## 2015-12-05 ENCOUNTER — Encounter: Payer: Self-pay | Admitting: Physician Assistant

## 2015-12-05 VITALS — BP 116/72 | HR 85 | Temp 98.6°F | Ht 71.25 in | Wt 203.4 lb

## 2015-12-05 DIAGNOSIS — G8929 Other chronic pain: Secondary | ICD-10-CM

## 2015-12-05 DIAGNOSIS — M549 Dorsalgia, unspecified: Principal | ICD-10-CM

## 2015-12-05 NOTE — Progress Notes (Signed)
BP 116/72 mmHg  Pulse 85  Temp(Src) 98.6 F (37 C)  Ht 5' 11.25" (1.81 m)  Wt 203 lb 6.4 oz (92.262 kg)  BMI 28.16 kg/m2  SpO2 98%   Subjective:    Patient ID: Jonathan Booth, male    DOB: [birth year], 45 y.o.   MRN: [id number]  HPI: Jonathan Booth is a 45 y.o. male presenting on 12/05/2015 for Back Pain   HPI  Chief Complaint  Patient presents with  . Back Pain    pt wants MRI    Pt says meds given in ER didn't do anything for him.  No changes in his back since going to the ER.  States no recent changes in back pain.   Relevant past medical, surgical, family and social history reviewed and updated as indicated. Interim medical history since our last visit reviewed. Allergies and medications reviewed and updated.  CURRENT   Current outpatient prescriptions:  .  FLUoxetine (PROZAC) 10 MG capsule, Take 20 mg by mouth daily., Disp: , Rfl:  .  baclofen (LIORESAL) 10 MG tablet, Take 1 tablet (10 mg total) by mouth 3 (three) times daily. (Patient not taking: Reported on 12/05/2015), Disp: 21 each, Rfl: 0 .  dexamethasone (DECADRON) 4 MG tablet, Take 1 tablet (4 mg total) by mouth 2 (two) times daily with a meal. (Patient not taking: Reported on 12/05/2015), Disp: 12 tablet, Rfl: 0 .  DULoxetine (CYMBALTA) 60 MG capsule, Take 60 mg by mouth daily. Reported on 12/05/2015, Disp: , Rfl:  .  ibuprofen (ADVIL,MOTRIN) 800 MG tablet, Take 1 tablet (800 mg total) by mouth 3 (three) times daily. (Patient not taking: Reported on 12/05/2015), Disp: 21 tablet, Rfl: 0 .  lovastatin (MEVACOR) 20 MG tablet, Take 1 tablet (20 mg total) by mouth at bedtime. (Patient not taking: Reported on 09/24/2015), Disp: 30 tablet, Rfl: 3 .  methocarbamol (ROBAXIN) 500 MG tablet, Take 1 tablet (500 mg total) by mouth 2 (two) times daily. (Patient not taking: Reported on 09/24/2015), Disp: 20 tablet, Rfl: 0 .  methylPREDNISolone (MEDROL DOSEPAK) 4 MG TBPK tablet, 6 po on day 1, then decrease by 1 per day (Patient not  taking: Reported on 09/24/2015), Disp: 21 tablet, Rfl: 0 .  pantoprazole (PROTONIX) 40 MG tablet, Take 1 tablet (40 mg total) by mouth daily. (Patient not taking: Reported on 09/24/2015), Disp: 60 tablet, Rfl: 3 .  traMADol (ULTRAM) 50 MG tablet, Take 1 tablet (50 mg total) by mouth every 6 (six) hours as needed. (Patient not taking: Reported on 09/24/2015), Disp: 15 tablet, Rfl: 0   Review of Systems  Constitutional: Negative for fever, chills, diaphoresis, appetite change, fatigue and unexpected weight change.  HENT: Positive for dental problem and sneezing. Negative for congestion, drooling, ear pain, facial swelling, hearing loss, mouth sores, sore throat, trouble swallowing and voice change.   Eyes: Negative for pain, discharge, redness, itching and visual disturbance.  Respiratory: Positive for cough, shortness of breath and wheezing. Negative for choking.   Cardiovascular: Negative for chest pain, palpitations and leg swelling.  Gastrointestinal: Negative for vomiting, abdominal pain, diarrhea, constipation and blood in stool.  Endocrine: Negative for cold intolerance, heat intolerance and polydipsia.  Genitourinary: Negative for dysuria, hematuria and decreased urine volume.  Musculoskeletal: Positive for back pain, arthralgias and gait problem.  Skin: Negative for rash.  Allergic/Immunologic: Negative for environmental allergies.  Neurological: Negative for seizures, syncope, light-headedness and headaches.  Hematological: Negative for adenopathy.  Psychiatric/Behavioral: Positive for dysphoric mood. Negative for  suicidal ideas and agitation. The patient is nervous/anxious.     Per HPI unless specifically indicated above     Objective:    BP 116/72 mmHg  Pulse 85  Temp(Src) 98.6 F (37 C)  Ht 5' 11.25" (1.81 m)  Wt 203 lb 6.4 oz (92.262 kg)  BMI 28.16 kg/m2  SpO2 98%  Wt Readings from Last 3 Encounters:  12/05/15 203 lb 6.4 oz (92.262 kg)  11/25/15 200 lb (90.719 kg)   09/24/15 204 lb (92.534 kg)    Physical Exam  Constitutional: He is oriented to person, place, and time. He appears well-developed and well-nourished.  HENT:  Head: Normocephalic and atraumatic.  Neck: Neck supple.  Cardiovascular: Normal rate and regular rhythm.   Pulmonary/Chest: Effort normal and breath sounds normal. He has no wheezes.  Abdominal: Soft. Bowel sounds are normal. There is no hepatosplenomegaly. There is no tenderness.  Musculoskeletal: He exhibits no edema.       Thoracic back: Normal.       Lumbar back: Normal.  Lymphadenopathy:    He has no cervical adenopathy.  Neurological: He is alert and oriented to person, place, and time. He has normal strength. No sensory deficit. Coordination and gait normal.  Reflex Scores:      Patellar reflexes are 2+ on the right side and 2+ on the left side. Skin: Skin is warm and dry.  Psychiatric: He has a normal mood and affect. His behavior is normal.  Vitals reviewed.       Assessment & Plan:   Encounter Diagnosis  Name Primary?  . Chronic back pain Yes    -Pt was ugly and argumentative insisting he had already had xrays and he needed an MRI.  atttempted to Discuss with him that he has had no xrays since 2009 which was eight years ago.  Tried to get him to understand that  It is appropriate to order xrays before MRI.  Pt showed no understanding and kept insisting only on mri.   -  xrays were ordered.  Will call with results -pt was given [facility] discount application -pt has f/u appointment in april

## 2015-12-24 ENCOUNTER — Ambulatory Visit: Payer: Self-pay | Admitting: Physician Assistant

## 2015-12-24 ENCOUNTER — Encounter: Payer: Self-pay | Admitting: Physician Assistant

## 2015-12-24 VITALS — BP 120/76 | HR 69 | Temp 98.2°F | Ht 71.25 in | Wt 204.4 lb

## 2015-12-24 DIAGNOSIS — G8929 Other chronic pain: Secondary | ICD-10-CM

## 2015-12-24 DIAGNOSIS — E785 Hyperlipidemia, unspecified: Secondary | ICD-10-CM

## 2015-12-24 DIAGNOSIS — K219 Gastro-esophageal reflux disease without esophagitis: Secondary | ICD-10-CM

## 2015-12-24 DIAGNOSIS — M549 Dorsalgia, unspecified: Secondary | ICD-10-CM

## 2015-12-24 DIAGNOSIS — F17210 Nicotine dependence, cigarettes, uncomplicated: Secondary | ICD-10-CM

## 2015-12-24 NOTE — Progress Notes (Signed)
BP 120/76 mmHg  Pulse 69  Temp(Src) 98.2 F (36.8 C)  Ht 5' 11.25" (1.81 m)  Wt 204 lb 6.4 oz (92.715 kg)  BMI 28.30 kg/m2  SpO2 99%   Subjective:    Patient ID: Jonathan Booth, male    DOB: October 29, 1970, 45 y.o.   MRN: [id number]  HPI: Jonathan Booth is a 45 y.o. male presenting on 12/24/2015 for Hyperlipidemia   HPI   Pt never got his blood drawn - he was supposed to go after his January appt.  He says he isn't having much problem with his reflux now, he says maybe once every two weeks.   Pt is still going to [facility] for MH.  Pt has stopped his protonix and lovastatin.  He is still smoking.   Relevant past medical, surgical, family and social history reviewed and updated as indicated. Interim medical history since our last visit reviewed. Allergies and medications reviewed and updated.   Current outpatient prescriptions:  .  FLUoxetine (PROZAC) 10 MG capsule, Take 20 mg by mouth daily., Disp: , Rfl:  .  ibuprofen (ADVIL,MOTRIN) 800 MG tablet, Take 1 tablet (800 mg total) by mouth 3 (three) times daily., Disp: 21 tablet, Rfl: 0   Review of Systems  Constitutional: Positive for diaphoresis. Negative for fever, chills, appetite change, fatigue and unexpected weight change.  HENT: Positive for dental problem. Negative for congestion, drooling, ear pain, facial swelling, hearing loss, mouth sores, sneezing, sore throat, trouble swallowing and voice change.   Eyes: Negative for pain, discharge, redness, itching and visual disturbance.  Respiratory: Positive for shortness of breath and wheezing. Negative for cough and choking.   Cardiovascular: Negative for chest pain, palpitations and leg swelling.  Gastrointestinal: Negative for vomiting, abdominal pain, diarrhea, constipation and blood in stool.  Endocrine: Negative for cold intolerance, heat intolerance and polydipsia.  Genitourinary: Negative for dysuria, hematuria and decreased urine volume.  Musculoskeletal: Positive for back  pain, arthralgias and gait problem.  Skin: Negative for rash.  Allergic/Immunologic: Negative for environmental allergies.  Neurological: Negative for seizures, syncope, light-headedness and headaches.  Hematological: Negative for adenopathy.  Psychiatric/Behavioral: Positive for dysphoric mood. Negative for suicidal ideas and agitation. The patient is nervous/anxious.     Per HPI unless specifically indicated above     Objective:    BP 120/76 mmHg  Pulse 69  Temp(Src) 98.2 F (36.8 C)  Ht 5' 11.25" (1.81 m)  Wt 204 lb 6.4 oz (92.715 kg)  BMI 28.30 kg/m2  SpO2 99%  Wt Readings from Last 3 Encounters:  12/24/15 204 lb 6.4 oz (92.715 kg)  12/05/15 203 lb 6.4 oz (92.262 kg)  11/25/15 200 lb (90.719 kg)    Physical Exam  Constitutional: He is oriented to person, place, and time. He appears well-developed and well-nourished.  HENT:  Head: Normocephalic and atraumatic.  Neck: Neck supple.  Cardiovascular: Normal rate and regular rhythm.   Pulmonary/Chest: Effort normal and breath sounds normal. He has no wheezes.  Abdominal: Soft. Bowel sounds are normal. There is no hepatosplenomegaly. There is no tenderness.  Musculoskeletal: He exhibits no edema.  Lymphadenopathy:    He has no cervical adenopathy.  Neurological: He is alert and oriented to person, place, and time.  Skin: Skin is warm and dry.  Psychiatric: He has a normal mood and affect. His behavior is normal.  Vitals reviewed.       Assessment & Plan:   Encounter Diagnoses  Name Primary?  . Hyperlipidemia Yes  . Chronic  back pain   . Gastroesophageal reflux disease, esophagitis presence not specified   . Cigarette nicotine dependence, uncomplicated     -pt Needs to get blood drawn -Counseled smoking cessation -F/u 1 month to review labs and, if needed, order lipitor from [employer]

## 2016-01-23 ENCOUNTER — Ambulatory Visit: Payer: Self-pay | Admitting: Physician Assistant

## 2016-01-30 ENCOUNTER — Ambulatory Visit: Payer: Self-pay | Admitting: Physician Assistant

## 2016-01-30 ENCOUNTER — Encounter: Payer: Self-pay | Admitting: Physician Assistant

## 2016-01-30 VITALS — BP 116/80 | HR 66 | Temp 98.1°F | Ht 71.25 in | Wt 203.6 lb

## 2016-01-30 DIAGNOSIS — J449 Chronic obstructive pulmonary disease, unspecified: Secondary | ICD-10-CM

## 2016-01-30 DIAGNOSIS — F32A Depression, unspecified: Secondary | ICD-10-CM

## 2016-01-30 DIAGNOSIS — E785 Hyperlipidemia, unspecified: Secondary | ICD-10-CM

## 2016-01-30 DIAGNOSIS — F329 Major depressive disorder, single episode, unspecified: Secondary | ICD-10-CM

## 2016-01-30 DIAGNOSIS — M549 Dorsalgia, unspecified: Secondary | ICD-10-CM

## 2016-01-30 DIAGNOSIS — G8929 Other chronic pain: Secondary | ICD-10-CM

## 2016-01-30 DIAGNOSIS — F17219 Nicotine dependence, cigarettes, with unspecified nicotine-induced disorders: Secondary | ICD-10-CM

## 2016-01-30 NOTE — Progress Notes (Signed)
BP 116/80 mmHg  Pulse 66  Temp(Src) 98.1 F (36.7 C)  Ht 5' 11.25" (1.81 m)  Wt 203 lb 9.6 oz (92.352 kg)  BMI 28.19 kg/m2  SpO2 98%   Subjective:    Patient ID: Jonathan Booth, male    DOB: 08/04/1971, 45 y.o.   MRN: [id number]  HPI: Jonathan RhymesDale C Gamero is a 45 y.o. male presenting on 01/30/2016 for Hyperlipidemia   HPI    Pt is going to [facility] for [facility] issues  He says he gets "a ball in his stomach" somtetimes when he gets nervous but it goes away when her relaxes.  No blood PR.  Pt has still not gotten labs drawn- there were ordered at January appointment.     Pt asks about his back.  No changes, just his usual chronic back pain.   He says he turned in his [employer] discount application (was given in march)  Relevant past medical, surgical, family and social history reviewed and updated as indicated. Interim medical history since our last visit reviewed. Allergies and medications reviewed and updated.   Current outpatient prescriptions:  .  FLUoxetine (PROZAC) 10 MG capsule, Take 20 mg by mouth daily., Disp: , Rfl:  .  ibuprofen (ADVIL,MOTRIN) 800 MG tablet, Take 1 tablet (800 mg total) by mouth 3 (three) times daily., Disp: 21 tablet, Rfl: 0   Review of Systems  Constitutional: Positive for diaphoresis. Negative for fever, chills, appetite change, fatigue and unexpected weight change.  HENT: Positive for dental problem. Negative for congestion, drooling, ear pain, facial swelling, hearing loss, mouth sores, sneezing, sore throat, trouble swallowing and voice change.   Eyes: Negative for pain, discharge, redness, itching and visual disturbance.  Respiratory: Positive for cough, shortness of breath and wheezing. Negative for choking.   Cardiovascular: Negative for chest pain, palpitations and leg swelling.  Gastrointestinal: Negative for vomiting, abdominal pain, diarrhea, constipation and blood in stool.  Endocrine: Negative for cold intolerance, heat intolerance and polydipsia.   Genitourinary: Negative for dysuria, hematuria and decreased urine volume.  Musculoskeletal: Positive for back pain, arthralgias and gait problem.  Skin: Negative for rash.  Allergic/Immunologic: Negative for environmental allergies.  Neurological: Negative for seizures, syncope, light-headedness and headaches.  Hematological: Negative for adenopathy.  Psychiatric/Behavioral: Positive for dysphoric mood. Negative for suicidal ideas and agitation. The patient is nervous/anxious.     Per HPI unless specifically indicated above     Objective:    BP 116/80 mmHg  Pulse 66  Temp(Src) 98.1 F (36.7 C)  Ht 5' 11.25" (1.81 m)  Wt 203 lb 9.6 oz (92.352 kg)  BMI 28.19 kg/m2  SpO2 98%  Wt Readings from Last 3 Encounters:  01/30/16 203 lb 9.6 oz (92.352 kg)  12/24/15 204 lb 6.4 oz (92.715 kg)  12/05/15 203 lb 6.4 oz (92.262 kg)    Physical Exam  Constitutional: He is oriented to person, place, and time. He appears well-developed and well-nourished.  HENT:  Head: Normocephalic and atraumatic.  Neck: Neck supple.  Cardiovascular: Normal rate and regular rhythm.   Pulmonary/Chest: Effort normal and breath sounds normal. He has no wheezes.  Abdominal: Soft. Bowel sounds are normal. There is no hepatosplenomegaly. There is no tenderness.  Musculoskeletal: He exhibits no edema.  Lymphadenopathy:    He has no cervical adenopathy.  Neurological: He is alert and oriented to person, place, and time.  Skin: Skin is warm and dry.  Psychiatric: He has a normal mood and affect. His behavior is normal.  Vitals  reviewed.       Assessment & Plan:    Encounter Diagnoses  Name Primary?  . Chronic obstructive pulmonary disease, unspecified COPD type (HCC) Yes  . Cigarette nicotine dependence with nicotine-induced disorder   . Chronic back pain   . Hyperlipidemia   . Depression      -Discussed with pt that he needs to get his labs drawn before we can do anything about his back.    -counseled on smoking cessation -F/u one month to review labs and discuss back

## 2016-01-31 ENCOUNTER — Other Ambulatory Visit: Payer: Self-pay | Admitting: Physician Assistant

## 2016-01-31 DIAGNOSIS — F32A Depression, unspecified: Secondary | ICD-10-CM | POA: Insufficient documentation

## 2016-01-31 DIAGNOSIS — F329 Major depressive disorder, single episode, unspecified: Secondary | ICD-10-CM | POA: Insufficient documentation

## 2016-01-31 LAB — COMPLETE METABOLIC PANEL WITH GFR
ALT: 27 U/L (ref 9–46)
AST: 26 U/L (ref 10–40)
Albumin: 4.5 g/dL (ref 3.6–5.1)
Alkaline Phosphatase: 52 U/L (ref 40–115)
BUN: 10 mg/dL (ref 7–25)
CHLORIDE: 100 mmol/L (ref 98–110)
CO2: 31 mmol/L (ref 20–31)
Calcium: 9.8 mg/dL (ref 8.6–10.3)
Creat: 1.21 mg/dL (ref 0.60–1.35)
GFR, Est African American: 84 mL/min (ref >=60)
GFR, Est Non African American: 73 mL/min (ref >=60)
GLUCOSE: 68 mg/dL (ref 65–99)
POTASSIUM: 4.4 mmol/L (ref 3.5–5.3)
SODIUM: 138 mmol/L (ref 135–146)
Total Bilirubin: 0.4 mg/dL (ref 0.2–1.2)
Total Protein: 7 g/dL (ref 6.1–8.1)

## 2016-01-31 LAB — LIPID PANEL
CHOL/HDL RATIO: 7.7 ratio — AB (ref <=5.0)
CHOLESTEROL: 224 mg/dL — AB (ref 125–200)
HDL: 29 mg/dL — ABNORMAL LOW (ref >=40)
LDL Cholesterol: 119 mg/dL (ref <130)
Triglycerides: 378 mg/dL — ABNORMAL HIGH (ref <150)
VLDL: 76 mg/dL — AB (ref <30)

## 2016-03-02 ENCOUNTER — Encounter: Payer: Self-pay | Admitting: Physician Assistant

## 2016-03-02 ENCOUNTER — Ambulatory Visit: Payer: Self-pay | Admitting: Physician Assistant

## 2016-03-02 VITALS — BP 118/76 | HR 64 | Temp 97.9°F | Ht 71.25 in | Wt 204.0 lb

## 2016-03-02 DIAGNOSIS — F17210 Nicotine dependence, cigarettes, uncomplicated: Secondary | ICD-10-CM

## 2016-03-02 DIAGNOSIS — E785 Hyperlipidemia, unspecified: Secondary | ICD-10-CM

## 2016-03-02 DIAGNOSIS — G8929 Other chronic pain: Secondary | ICD-10-CM

## 2016-03-02 DIAGNOSIS — M549 Dorsalgia, unspecified: Secondary | ICD-10-CM

## 2016-03-02 MED ORDER — ATORVASTATIN CALCIUM 10 MG PO TABS: 10.0000 mg | ORAL_TABLET | Freq: Every day | ORAL | Status: AC

## 2016-03-02 NOTE — Progress Notes (Signed)
BP 118/76 mmHg  Pulse 64  Temp(Src) 97.9 F (36.6 C)  Ht 5' 11.25" (1.81 m)  Wt 204 lb (92.534 kg)  BMI 28.25 kg/m2  SpO2 98%   Subjective:    Patient ID: Jonathan Booth, male    DOB: 1971-04-01, 45 y.o.   MRN: [id number]  HPI: DAEVON HOLDREN is a 45 y.o. male presenting on 03/02/2016 for Hyperlipidemia   HPI   Pt is going to [facility] for MH issues- has appt 6/19. He ran out of his prozac a month ago  Pt turned in [facility] discount application about 2 months ago. He hasn't heard anything yet.    Pt has not gotten xrays done yet that were ordered in march.  Relevant past medical, surgical, family and social history reviewed and updated as indicated. Interim medical history since our last visit reviewed. Allergies and medications reviewed and updated.   Current outpatient prescriptions:  .  ibuprofen (ADVIL,MOTRIN) 800 MG tablet, Take 1 tablet (800 mg total) by mouth 3 (three) times daily., Disp: 21 tablet, Rfl: 0   Review of Systems  Constitutional: Positive for diaphoresis. Negative for fever, chills, appetite change, fatigue and unexpected weight change.  HENT: Positive for dental problem. Negative for congestion, drooling, ear pain, facial swelling, hearing loss, mouth sores, sneezing, sore throat, trouble swallowing and voice change.   Eyes: Negative for pain, discharge, redness, itching and visual disturbance.  Respiratory: Positive for shortness of breath and wheezing. Negative for cough and choking.   Cardiovascular: Negative for chest pain, palpitations and leg swelling.  Gastrointestinal: Negative for vomiting, abdominal pain, diarrhea, constipation and blood in stool.  Endocrine: Negative for cold intolerance, heat intolerance and polydipsia.  Genitourinary: Negative for dysuria, hematuria and decreased urine volume.  Musculoskeletal: Positive for back pain, arthralgias and gait problem.  Skin: Negative for rash.  Allergic/Immunologic: Negative for environmental  allergies.  Neurological: Negative for seizures, syncope, light-headedness and headaches.  Hematological: Negative for adenopathy.  Psychiatric/Behavioral: Positive for dysphoric mood. Negative for suicidal ideas and agitation. The patient is nervous/anxious.     Per HPI unless specifically indicated above     Objective:    BP 118/76 mmHg  Pulse 64  Temp(Src) 97.9 F (36.6 C)  Ht 5' 11.25" (1.81 m)  Wt 204 lb (92.534 kg)  BMI 28.25 kg/m2  SpO2 98%  Wt Readings from Last 3 Encounters:  03/02/16 204 lb (92.534 kg)  01/30/16 203 lb 9.6 oz (92.352 kg)  12/24/15 204 lb 6.4 oz (92.715 kg)    Physical Exam  Constitutional: He is oriented to person, place, and time. He appears well-developed and well-nourished.  HENT:  Head: Normocephalic and atraumatic.  Neck: Neck supple.  Cardiovascular: Normal rate and regular rhythm.   Pulmonary/Chest: Effort normal and breath sounds normal. He has no wheezes.  Abdominal: Soft. Bowel sounds are normal. There is no hepatosplenomegaly. There is no tenderness.  Musculoskeletal: He exhibits no edema.  Lymphadenopathy:    He has no cervical adenopathy.  Neurological: He is alert and oriented to person, place, and time.  Skin: Skin is warm and dry.  Psychiatric: He has a normal mood and affect. His behavior is normal.  Vitals reviewed.   Results for orders placed or performed in visit on 01/31/16  COMPLETE METABOLIC PANEL WITH GFR  Result Value Ref Range   Sodium 138 135 - 146 mmol/L   Potassium 4.4 3.5 - 5.3 mmol/L   Chloride 100 98 - 110 mmol/L   CO2 31 20 -  31 mmol/L   Glucose, Bld 68 65 - 99 mg/dL   BUN 10 7 - 25 mg/dL   Creat 1.611.21 0.960.60 - 0.451.35 mg/dL   Total Bilirubin 0.4 0.2 - 1.2 mg/dL   Alkaline Phosphatase 52 40 - 115 U/L   AST 26 10 - 40 U/L   ALT 27 9 - 46 U/L   Total Protein 7.0 6.1 - 8.1 g/dL   Albumin 4.5 3.6 - 5.1 g/dL   Calcium 9.8 8.6 - 40.910.3 mg/dL   GFR, Est African American 84 >=60 mL/min   GFR, Est Non African  American 73 >=60 mL/min  Lipid panel  Result Value Ref Range   Cholesterol 224 (H) 125 - 200 mg/dL   Triglycerides 811378 (H) <150 mg/dL   HDL 29 (L) >=91>=40 mg/dL   Total CHOL/HDL Ratio 7.7 (H) <=5.0 Ratio   VLDL 76 (H) <30 mg/dL   LDL Cholesterol 478119 <295<130 mg/dL      Assessment & Plan:   Encounter Diagnoses  Name Primary?  . Hyperlipidemia Yes  . Cigarette nicotine dependence, uncomplicated   . Chronic back pain     -reviewed labs with pt  -rx lipitor 10mg  and fish oil and counseled on lowfat diet. Gave handout -counseled on Smoking cessation -pt to Get xrays that were ordered in march -F/u 1 montn to review xrays

## 2016-03-02 NOTE — Patient Instructions (Signed)
Start fish oil 2 daily (fish oil is over the counter)  Fat and Cholesterol Restricted Diet High levels of fat and cholesterol in your blood may lead to various health problems, such as diseases of the heart, blood vessels, gallbladder, liver, and pancreas. Fats are concentrated sources of energy that come in various forms. Certain types of fat, including saturated fat, may be harmful in excess. Cholesterol is a substance needed by your body in small amounts. Your body makes all the cholesterol it needs. Excess cholesterol comes from the food you eat. When you have high levels of cholesterol and saturated fat in your blood, health problems can develop because the excess fat and cholesterol will gather along the walls of your blood vessels, causing them to narrow. Choosing the right foods will help you control your intake of fat and cholesterol. This will help keep the levels of these substances in your blood within normal limits and reduce your risk of disease. WHAT IS MY PLAN? Your health care provider recommends that you:  Get no more than __________ % of the total calories in your daily diet from fat.  Limit your intake of saturated fat to less than ______% of your total calories each day.  Limit the amount of cholesterol in your diet to less than _________mg per day. WHAT TYPES OF FAT SHOULD I CHOOSE?  Choose healthy fats more often. Choose monounsaturated and polyunsaturated fats, such as olive and canola oil, flaxseeds, walnuts, almonds, and seeds.  Eat more omega-3 fats. Good choices include salmon, mackerel, sardines, tuna, flaxseed oil, and ground flaxseeds. Aim to eat fish at least two times a week.  Limit saturated fats. Saturated fats are primarily found in animal products, such as meats, butter, and cream. Plant sources of saturated fats include palm oil, palm kernel oil, and coconut oil.  Avoid foods with partially hydrogenated oils in them. These contain trans fats. Examples of  foods that contain trans fats are stick margarine, some tub margarines, cookies, crackers, and other baked goods. WHAT GENERAL GUIDELINES DO I NEED TO FOLLOW? These guidelines for healthy eating will help you control your intake of fat and cholesterol:  Check food labels carefully to identify foods with trans fats or high amounts of saturated fat.  Fill one half of your plate with vegetables and green salads.  Fill one fourth of your plate with whole grains. Look for the word "whole" as the first word in the ingredient list.  Fill one fourth of your plate with lean protein foods.  Limit fruit to two servings a day. Choose fruit instead of juice.  Eat more foods that contain soluble fiber. Examples of foods that contain this type of fiber are apples, broccoli, carrots, beans, peas, and barley. Aim to get 20-30 g of fiber per day.  Eat more home-cooked food and less restaurant, buffet, and fast food.  Limit or avoid alcohol.  Limit foods high in starch and sugar.  Limit fried foods.  Cook foods using methods other than frying. Baking, boiling, grilling, and broiling are all great options.  Lose weight if you are overweight. Losing just 5-10% of your initial body weight can help your overall health and prevent diseases such as diabetes and heart disease. WHAT FOODS CAN I EAT? Grains Whole grains, such as whole wheat or whole grain breads, crackers, cereals, and pasta. Unsweetened oatmeal, bulgur, barley, quinoa, or brown rice. Corn or whole wheat flour tortillas. Vegetables Fresh or frozen vegetables (raw, steamed, roasted, or grilled). Green salads.  Fruits All fresh, canned (in natural juice), or frozen fruits. Meat and Other Protein Products Ground beef (85% or leaner), grass-fed beef, or beef trimmed of fat. Skinless chicken or [place]. Ground chicken or [place]. Pork trimmed of fat. All fish and seafood. Eggs. Dried beans, peas, or lentils. Unsalted nuts or seeds. Unsalted canned or  dry beans. Dairy Low-fat dairy products, such as skim or 1% milk, 2% or reduced-fat cheeses, low-fat ricotta or cottage cheese, or plain low-fat yogurt. Fats and Oils Tub margarines without trans fats. Light or reduced-fat mayonnaise and salad dressings. Avocado. Olive, canola, sesame, or safflower oils. Natural peanut or almond butter (choose ones without added sugar and oil). The items listed above may not be a complete list of recommended foods or beverages. Contact your dietitian for more options. WHAT FOODS ARE NOT RECOMMENDED? Grains White bread. White pasta. White rice. Cornbread. Bagels, pastries, and croissants. Crackers that contain trans fat. Vegetables White potatoes. Corn. Creamed or fried vegetables. Vegetables in a cheese sauce. Fruits Dried fruits. Canned fruit in light or heavy syrup. Fruit juice. Meat and Other Protein Products Fatty cuts of meat. Ribs, chicken wings, bacon, sausage, bologna, salami, chitterlings, fatback, hot dogs, bratwurst, and packaged luncheon meats. Liver and organ meats. Dairy Whole or 2% milk, cream, half-and-half, and cream cheese. Whole milk cheeses. Whole-fat or sweetened yogurt. Full-fat cheeses. Nondairy creamers and whipped toppings. Processed cheese, cheese spreads, or cheese curds. Sweets and Desserts Corn syrup, sugars, honey, and molasses. Candy. Jam and jelly. Syrup. Sweetened cereals. Cookies, pies, cakes, donuts, muffins, and ice cream. Fats and Oils Butter, stick margarine, lard, shortening, ghee, or bacon fat. Coconut, palm kernel, or palm oils. Beverages Alcohol. Sweetened drinks (such as sodas, lemonade, and fruit drinks or punches). The items listed above may not be a complete list of foods and beverages to avoid. Contact your dietitian for more information.   This information is not intended to replace advice given to you by your health care provider. Make sure you discuss any questions you have with your health care provider.    Document Released: 09/07/2005 Document Revised: 09/28/2014 Document Reviewed: 12/06/2013 Elsevier Interactive Patient Education [employer]2016 [employer] Inc.

## 2016-03-05 ENCOUNTER — Ambulatory Visit (HOSPITAL_COMMUNITY)
Admission: RE | Admit: 2016-03-05 | Discharge: 2016-03-05 | Disposition: A | Discharge status: Home / Self Care | Payer: Self-pay | Source: Ambulatory Visit | Attending: Physician Assistant | Admitting: Physician Assistant

## 2016-03-05 DIAGNOSIS — M549 Dorsalgia, unspecified: Secondary | ICD-10-CM | POA: Insufficient documentation

## 2016-03-05 DIAGNOSIS — G8929 Other chronic pain: Secondary | ICD-10-CM | POA: Insufficient documentation

## 2016-04-01 ENCOUNTER — Ambulatory Visit: Payer: Self-pay | Admitting: Physician Assistant

## 2016-04-01 ENCOUNTER — Encounter: Payer: Self-pay | Admitting: Physician Assistant

## 2016-04-01 VITALS — BP 108/78 | HR 67 | Temp 97.9°F | Ht 71.25 in | Wt 202.0 lb

## 2016-04-01 DIAGNOSIS — M549 Dorsalgia, unspecified: Principal | ICD-10-CM

## 2016-04-01 DIAGNOSIS — E785 Hyperlipidemia, unspecified: Secondary | ICD-10-CM

## 2016-04-01 DIAGNOSIS — G8929 Other chronic pain: Secondary | ICD-10-CM

## 2016-04-01 NOTE — Progress Notes (Signed)
BP 108/78 mmHg  Pulse 67  Temp(Src) 97.9 F (36.6 C)  Ht 5' 11.25" (1.81 m)  Wt 202 lb (91.627 kg)  BMI 27.97 kg/m2  SpO2 98%   Subjective:    Patient ID: Jonathan Booth, male    DOB: 04/10/1971, 45 y.o.   MRN: [id number]  HPI: Jonathan RhymesDale C Gottlieb is a 45 y.o. male presenting on 04/01/2016 for Results   HPI  States LBP x 8 years, worse with activity.  No sciatica.  Reviewed xrays with pt.  Pt states he has never seen a specialist for evaluation of his back pain  Relevant past medical, surgical, family and social history reviewed and updated as indicated. Interim medical history since our last visit reviewed. Allergies and medications reviewed and updated.   Current outpatient prescriptions:  .  atorvastatin (LIPITOR) 10 MG tablet, Take 1 tablet (10 mg total) by mouth daily., Disp: 90 tablet, Rfl: 1 .  ibuprofen (ADVIL,MOTRIN) 800 MG tablet, Take 1 tablet (800 mg total) by mouth 3 (three) times daily. (Patient taking differently: Take 800 mg by mouth as needed. ), Disp: 21 tablet, Rfl: 0   Review of Systems  Constitutional: Negative for fever, chills, diaphoresis, appetite change, fatigue and unexpected weight change.  HENT: Positive for congestion and dental problem. Negative for drooling, ear pain, facial swelling, hearing loss, mouth sores, sneezing, sore throat, trouble swallowing and voice change.   Eyes: Negative for pain, discharge, redness, itching and visual disturbance.  Respiratory: Positive for cough and shortness of breath. Negative for choking and wheezing.   Cardiovascular: Negative for chest pain, palpitations and leg swelling.  Gastrointestinal: Negative for vomiting, abdominal pain, diarrhea, constipation and blood in stool.  Endocrine: Negative for cold intolerance, heat intolerance and polydipsia.  Genitourinary: Negative for dysuria, hematuria and decreased urine volume.  Musculoskeletal: Positive for back pain, arthralgias and gait problem.  Skin: Negative for  rash.  Allergic/Immunologic: Negative for environmental allergies.  Neurological: Negative for seizures, syncope, light-headedness and headaches.  Hematological: Negative for adenopathy.  Psychiatric/Behavioral: Positive for dysphoric mood. Negative for suicidal ideas and agitation. The patient is nervous/anxious.     Per HPI unless specifically indicated above     Objective:    BP 108/78 mmHg  Pulse 67  Temp(Src) 97.9 F (36.6 C)  Ht 5' 11.25" (1.81 m)  Wt 202 lb (91.627 kg)  BMI 27.97 kg/m2  SpO2 98%  Wt Readings from Last 3 Encounters:  04/01/16 202 lb (91.627 kg)  03/02/16 204 lb (92.534 kg)  01/30/16 203 lb 9.6 oz (92.352 kg)    Physical Exam  Constitutional: He is oriented to person, place, and time. He appears well-developed and well-nourished.  HENT:  Head: Normocephalic and atraumatic.  Neck: Neck supple.  Cardiovascular: Normal rate and regular rhythm.   Pulmonary/Chest: Effort normal and breath sounds normal. He has no wheezes.  Abdominal: Soft. Bowel sounds are normal. There is no hepatosplenomegaly. There is no tenderness.  Musculoskeletal: He exhibits no edema.       Lumbar back: Normal. He exhibits normal range of motion, no tenderness, no bony tenderness, no swelling, no edema and no deformity.  Lymphadenopathy:    He has no cervical adenopathy.  Neurological: He is alert and oriented to person, place, and time.  Skin: Skin is warm and dry.  Psychiatric: He has a normal mood and affect. His behavior is normal.  Vitals reviewed.       Assessment & Plan:   Encounter Diagnoses  Name Primary?  .Marland Kitchen  Chronic back pain Yes  . Hyperlipidemia     -reviewed xray results with pt -Will send to orthopedics for further evaluation and treatment -  [facility] discount active thru 9/16 -F/u 3 months.  RTO sooner prn

## 2016-04-15 ENCOUNTER — Ambulatory Visit (INDEPENDENT_AMBULATORY_CARE_PROVIDER_SITE_OTHER): Payer: Self-pay | Admitting: Orthopaedic Surgery

## 2016-04-15 ENCOUNTER — Encounter: Payer: Self-pay | Admitting: Orthopaedic Surgery

## 2016-04-15 VITALS — BP 122/75 | HR 57 | Temp 98.1°F | Ht 73.0 in | Wt 200.0 lb

## 2016-04-15 DIAGNOSIS — M5441 Lumbago with sciatica, right side: Secondary | ICD-10-CM

## 2016-04-15 DIAGNOSIS — F17200 Nicotine dependence, unspecified, uncomplicated: Secondary | ICD-10-CM

## 2016-04-15 MED ORDER — NAPROXEN 500 MG PO TABS: 500.0000 mg | ORAL_TABLET | Freq: Two times a day (BID) | ORAL | 5 refills | Status: DC

## 2016-04-15 MED ORDER — HYDROCODONE-ACETAMINOPHEN 5-325 MG PO TABS: 1.0000 | ORAL_TABLET | ORAL | 0 refills | Status: DC | PRN

## 2016-04-15 NOTE — Patient Instructions (Addendum)
Get MRI of lower back.  Scheduled at [facility] for 04/28/16 at 6:00pm. The patient was notified in the office.  Return after MRI.

## 2016-04-15 NOTE — Progress Notes (Signed)
Subjective: My back hurts    Patient ID: Jonathan Booth, male    DOB: 05/29/1971, 45 y.o.   MRN: [id number]  HPI He has a nine year history of lower back pain that occurred after lifting a heavy lawnmower up to a vehicle.  He has had progressively increasing pain in the lower back over the last few years.  He has right sided paresthesias.  He has no weakness, no bowel or bladder problems.  He has tried ice, heat, rubs, exercises with no help.  He has more pain now in the lower back.  He could not afford a MRI and has not had one.  He has pain with he raises his right leg.  He is not sleeping well.  He cannot stand or sit for long periods of time.  He has no new trauma.   Review of Systems  HENT: Negative for congestion.   Respiratory: Negative for cough and shortness of breath.   Cardiovascular: Negative for chest pain and leg swelling.  Endocrine: Negative for cold intolerance.  Musculoskeletal: Positive for arthralgias and back pain.  Allergic/Immunologic: Negative for environmental allergies.  Psychiatric/Behavioral: The patient is nervous/anxious.    Past Medical History:  Diagnosis Date  . Anxiety   . Back pain   . Depression   . GERD (gastroesophageal reflux disease)     History reviewed. No pertinent surgical history.  Current Outpatient Prescriptions on File Prior to Visit  Medication Sig Dispense Refill  . atorvastatin (LIPITOR) 10 MG tablet Take 1 tablet (10 mg total) by mouth daily. 90 tablet 1  . ibuprofen (ADVIL,MOTRIN) 800 MG tablet Take 1 tablet (800 mg total) by mouth 3 (three) times daily. (Patient taking differently: Take 800 mg by mouth as needed. ) 21 tablet 0   No current facility-administered medications on file prior to visit.     Social History   Social History  . Marital status: Married    Spouse name: N/A  . Number of children: N/A  . Years of education: N/A   Occupational History  . Not on file.   Social History Main Topics  . Smoking status:  Current Every Day Smoker    Packs/day: 0.50    Years: 30.00    Types: Cigarettes  . Smokeless tobacco: Never Used  . Alcohol use No  . Drug use: No  . Sexual activity: Not on file   Other Topics Concern  . Not on file   Social History Narrative  . No narrative on file    Family History  Problem Relation Age of Onset  . Heart disease Father   . Hypertension Father   . Cirrhosis Mother     BP 122/75 (BP Location: Right Arm, Patient Position: Sitting)   Pulse (!) 57   Temp 98.1 F (36.7 C)   Ht 6\' 1"  (1.854 m)   Wt 200 lb (90.7 kg)   BMI 26.39 kg/m      Objective:   Physical Exam  Constitutional: He is oriented to person, place, and time. He appears well-developed and well-nourished.  HENT:  Head: Normocephalic and atraumatic.  Eyes: Conjunctivae and EOM are normal. Pupils are equal, round, and reactive to light.  Neck: Normal range of motion. Neck supple.  Cardiovascular: Normal rate, regular rhythm and intact distal pulses.   Pulmonary/Chest: Effort normal.  Abdominal: Soft.  Musculoskeletal: He exhibits tenderness (Lower back is tender.  SLR positive on right at 25 degrees.).  Neurological: He is alert and oriented  to person, place, and time. He has normal reflexes. No cranial nerve deficit. He exhibits normal muscle tone. Coordination normal.  Skin: Skin is warm and dry.  Psychiatric: He has a normal mood and affect. His behavior is normal. Judgment and thought content normal.   Spine/Pelvis examination:  Inspection:  Overall, sacoiliac joint benign and hips nontender; without crepitus or defects.   Thoracic spine inspection: Alignment normal without kyphosis present   Lumbar spine inspection:  Alignment  with normal lumbar lordosis, without scoliosis apparent.   Thoracic spine palpation:  without tenderness of spinal processes   Lumbar spine palpation: with tenderness of lumbar area; without tightness of lumbar muscles    Range of Motion:   Lumbar  flexion, forward flexion is 30 with pain or tenderness    Lumbar extension is 5 with pain or tenderness   Left lateral bend is Normal  without pain or tenderness   Right lateral bend is Normal without pain or tenderness   Straight leg raising is positive at 25 degrees on the right   Strength & tone: Normal   Stability overall normal stability          Assessment & Plan:   Encounter Diagnoses  Name Primary?  . Right-sided low back pain with right-sided sciatica Yes  . Tobacco use disorder    I will get a MRI of the lumbar spine.  I have talked to him about cutting back on smoking.  He is agreeable to this.  Call if any problem.  Electronically Signed Darreld Mclean, MD 7/26/201712:55 PM

## 2016-04-28 ENCOUNTER — Ambulatory Visit (HOSPITAL_COMMUNITY)
Admission: RE | Admit: 2016-04-28 | Discharge: 2016-04-28 | Disposition: A | Discharge status: Home / Self Care | Payer: No Typology Code available for payment source | Source: Ambulatory Visit | Attending: Orthopaedic Surgery | Admitting: Orthopaedic Surgery

## 2016-04-28 DIAGNOSIS — M5136 Other intervertebral disc degeneration, lumbar region: Secondary | ICD-10-CM | POA: Insufficient documentation

## 2016-04-28 DIAGNOSIS — M5441 Lumbago with sciatica, right side: Secondary | ICD-10-CM

## 2016-04-28 DIAGNOSIS — M5134 Other intervertebral disc degeneration, thoracic region: Secondary | ICD-10-CM | POA: Insufficient documentation

## 2016-04-29 ENCOUNTER — Ambulatory Visit (INDEPENDENT_AMBULATORY_CARE_PROVIDER_SITE_OTHER): Payer: Self-pay | Admitting: Orthopaedic Surgery

## 2016-04-29 ENCOUNTER — Encounter: Payer: Self-pay | Admitting: Orthopaedic Surgery

## 2016-04-29 VITALS — BP 159/96 | HR 62 | Temp 97.7°F | Ht 74.0 in | Wt 204.0 lb

## 2016-04-29 DIAGNOSIS — F17200 Nicotine dependence, unspecified, uncomplicated: Secondary | ICD-10-CM

## 2016-04-29 DIAGNOSIS — M5441 Lumbago with sciatica, right side: Secondary | ICD-10-CM

## 2016-04-29 MED ORDER — TIZANIDINE HCL 4 MG PO TABS: ORAL_TABLET | ORAL | 3 refills | Status: AC

## 2016-04-29 NOTE — Patient Instructions (Signed)
Back Pain, Adult Back pain is very common. The pain often gets better over time. The cause of back pain is usually not dangerous. Most people can learn to manage their back pain on their own.  HOME CARE  Watch your back pain for any changes. The following actions may help to lessen any pain you are feeling:  Stay active. Start with short walks on flat ground if you can. Try to walk farther each day.  Exercise regularly as told by your doctor. Exercise helps your back heal faster. It also helps avoid future injury by keeping your muscles strong and flexible.  Do not sit, drive, or stand in one place for more than 30 minutes.  Do not stay in bed. Resting more than 1-2 days can slow down your recovery.  Be careful when you bend or lift an object. Use good form when lifting:  Bend at your knees.  Keep the object close to your body.  Do not twist.  Sleep on a firm mattress. Lie on your side, and bend your knees. If you lie on your back, put a pillow under your knees.  Take medicines only as told by your doctor.  Put ice on the injured area.  Put ice in a plastic bag.  Place a towel between your skin and the bag.  Leave the ice on for 20 minutes, 2-3 times a day for the first 2-3 days. After that, you can switch between ice and heat packs.  Avoid feeling anxious or stressed. Find good ways to deal with stress, such as exercise.  Maintain a healthy weight. Extra weight puts stress on your back. GET HELP IF:   You have pain that does not go away with rest or medicine.  You have worsening pain that goes down into your legs or buttocks.  You have pain that does not get better in one week.  You have pain at night.  You lose weight.  You have a fever or chills. GET HELP RIGHT AWAY IF:   You cannot control when you poop (bowel movement) or pee (urinate).  Your arms or legs feel weak.  Your arms or legs lose feeling (numbness).  You feel sick to your stomach (nauseous) or  throw up (vomit).  You have belly (abdominal) pain.  You feel like you may pass out (faint).   This information is not intended to replace advice given to you by your health care provider. Make sure you discuss any questions you have with your health care provider.   Document Released: 02/24/2008 Document Revised: 09/28/2014 Document Reviewed: 01/09/2014 Elsevier Interactive Patient Education 2016 [employer] Smoking Cessation, Tips for Success If you are ready to quit smoking, congratulations! You have chosen to help yourself be healthier. Cigarettes bring nicotine, tar, carbon monoxide, and other irritants into your body. Your lungs, heart, and blood vessels will be able to work better without these poisons. There are many different ways to quit smoking. Nicotine gum, nicotine patches, a nicotine inhaler, or nicotine nasal spray can help with physical craving. Hypnosis, support groups, and medicines help break the habit of smoking. WHAT THINGS CAN I DO TO MAKE QUITTING EASIER?  Here are some tips to help you quit for good:  Pick a date when you will quit smoking completely. Tell all of your friends and family about your plan to quit on that date.  Do not try to slowly cut down on the number of cigarettes you are smoking. Pick a quit date and  quit smoking completely starting on that day.  Throw away all cigarettes.   Clean and remove all ashtrays from your home, work, and car.  On a card, write down your reasons for quitting. Carry the card with you and read it when you get the urge to smoke.  Cleanse your body of nicotine. Drink enough water and fluids to keep your urine clear or pale yellow. Do this after quitting to flush the nicotine from your body.  Learn to predict your moods. Do not let a bad situation be your excuse to have a cigarette. Some situations in your life might tempt you into wanting a cigarette.  Never have "just one" cigarette. It leads to wanting another and  another. Remind yourself of your decision to quit.  Change habits associated with smoking. If you smoked while driving or when feeling stressed, try other activities to replace smoking. Stand up when drinking your coffee. Brush your teeth after eating. Sit in a different chair when you read the paper. Avoid alcohol while trying to quit, and try to drink fewer caffeinated beverages. Alcohol and caffeine may urge you to smoke.  Avoid foods and drinks that can trigger a desire to smoke, such as sugary or spicy foods and alcohol.  Ask people who smoke not to smoke around you.  Have something planned to do right after eating or having a cup of coffee. For example, plan to take a walk or exercise.  Try a relaxation exercise to calm you down and decrease your stress. Remember, you may be tense and nervous for the first 2 weeks after you quit, but this will pass.  Find new activities to keep your hands busy. Play with a pen, coin, or rubber band. Doodle or draw things on paper.  Brush your teeth right after eating. This will help cut down on the craving for the taste of tobacco after meals. You can also try mouthwash.   Use oral substitutes in place of cigarettes. Try using lemon drops, carrots, cinnamon sticks, or chewing gum. Keep them handy so they are available when you have the urge to smoke.  When you have the urge to smoke, try deep breathing.  Designate your home as a nonsmoking area.  If you are a heavy smoker, ask your health care provider about a prescription for nicotine chewing gum. It can ease your withdrawal from nicotine.  Reward yourself. Set aside the cigarette money you save and buy yourself something nice.  Look for support from others. Join a support group or smoking cessation program. Ask someone at home or at work to help you with your plan to quit smoking.  Always ask yourself, "Do I need this cigarette or is this just a reflex?" Tell yourself, "Today, I choose not to  smoke," or "I do not want to smoke." You are reminding yourself of your decision to quit.  Do not replace cigarette smoking with electronic cigarettes (commonly called e-cigarettes). The safety of e-cigarettes is unknown, and some may contain harmful chemicals.  If you relapse, do not give up! Plan ahead and think about what you will do the next time you get the urge to smoke. HOW WILL I FEEL WHEN I QUIT SMOKING? You may have symptoms of withdrawal because your body is used to nicotine (the addictive substance in cigarettes). You may crave cigarettes, be irritable, feel very hungry, cough often, get headaches, or have difficulty concentrating. The withdrawal symptoms are only temporary. They are strongest when you first quit  but will go away within 10-14 days. When withdrawal symptoms occur, stay in control. Think about your reasons for quitting. Remind yourself that these are signs that your body is healing and getting used to being without cigarettes. Remember that withdrawal symptoms are easier to treat than the major diseases that smoking can cause.  Even after the withdrawal is over, expect periodic urges to smoke. However, these cravings are generally short lived and will go away whether you smoke or not. Do not smoke! WHAT RESOURCES ARE AVAILABLE TO HELP ME QUIT SMOKING? Your health care provider can direct you to community resources or hospitals for support, which may include:  Group support.  Education.  Hypnosis.  Therapy.   This information is not intended to replace advice given to you by your health care provider. Make sure you discuss any questions you have with your health care provider.   Document Released: 06/05/2004 Document Revised: 09/28/2014 Document Reviewed: 02/23/2013 Elsevier Interactive Patient Education [employer]

## 2016-04-29 NOTE — Progress Notes (Signed)
Patient Jonathan Booth, male DOB:11-03-1970, 45 y.o. WJX:[id number]  Chief Complaint  Patient presents with  . Results    MRI L Spine    HPI  Jonathan Booth is a 45 y.o. male who has had chronic back pain for over nine years.  He has had a new episode of left sided pain just yesterday.  He has no new trauma, no paresthesias.  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  I have explained the findings to him.  I will add muscle relaxant and set up PT for him.  I will see him in two weeks. HPI  Body mass index is 26.19 kg/m.  ROS  Review of Systems  HENT: Negative for congestion.   Respiratory: Negative for cough and shortness of breath.   Cardiovascular: Negative for chest pain and leg swelling.  Endocrine: Negative for cold intolerance.  Musculoskeletal: Positive for arthralgias and back pain.  Allergic/Immunologic: Negative for environmental allergies.  Psychiatric/Behavioral: The patient is nervous/anxious.     Past Medical History:  Diagnosis Date  . Anxiety   . Back pain   . Depression   . GERD (gastroesophageal reflux disease)     No past surgical history on file.  Family History  Problem Relation Age of Onset  . Heart disease Father   . Hypertension Father   . Cirrhosis Mother     Social History Social History  Substance Use Topics  . Smoking status: Current Every Day Smoker    Packs/day: 0.50    Years: 30.00    Types: Cigarettes  . Smokeless tobacco: Never Used  . Alcohol use No    No Known Allergies  Current Outpatient Prescriptions  Medication Sig Dispense Refill  . atorvastatin (LIPITOR) 10 MG tablet Take 1 tablet (10 mg total) by mouth daily. 90 tablet 1  . busPIRone (BUSPAR) 5 MG tablet Take 5 mg by mouth 3 (three) times daily.    Marland Kitchen HYDROcodone-acetaminophen (NORCO/VICODIN) 5-325 MG tablet Take 1 tablet by  mouth every 4 (four) hours as needed for moderate pain (Must last 15 days.Do not take and drive a car or use machinery.). 60 tablet 0  . ibuprofen (ADVIL,MOTRIN) 800 MG tablet Take 1 tablet (800 mg total) by mouth 3 (three) times daily. (Patient taking differently: Take 800 mg by mouth as needed. ) 21 tablet 0  . naproxen (NAPROSYN) 500 MG tablet Take 1 tablet (500 mg total) by mouth 2 (two) times daily with a meal. 60 tablet 5   No current facility-administered medications for this visit.      Physical Exam  Blood pressure (!) 159/96, pulse 62, temperature 97.7 F (36.5 C), height  (1.88 m), weight 204 lb (92.5 kg).  Constitutional: overall normal hygiene, normal nutrition, well developed, normal grooming, normal body habitus. Assistive device:none  Musculoskeletal: gait and station Limp none, muscle tone and strength are normal, no tremors or atrophy is present.  .  Neurological: coordination overall normal.  Deep tendon reflex/nerve stretch intact.  Sensation normal.  Cranial nerves II-XII intact.   Skin:   normal overall no scars, lesions, ulcers or rashes. No psoriasis.  Psychiatric: Alert and oriented x 3.  Recent memory intact, remote memory unclear.  Normal mood and affect. Well groomed.  Good eye contact.  Cardiovascular: overall no swelling, no varicosities, no edema bilaterally, normal temperatures of the legs and arms, no clubbing, cyanosis  and good capillary refill.  Lymphatic: palpation is normal.  Spine/Pelvis examination:  Inspection:  Overall, sacoiliac joint benign and hips nontender; without crepitus or defects.   Thoracic spine inspection: Alignment normal without kyphosis present   Lumbar spine inspection:  Alignment  with normal lumbar lordosis, without scoliosis apparent.   Thoracic spine palpation:  without tenderness of spinal processes   Lumbar spine palpation: with tenderness of lumbar area; without tightness of lumbar muscles    Range of  Motion:   Lumbar flexion, forward flexion is 40 without pain or tenderness    Lumbar extension is 10 without pain or tenderness   Left lateral bend is Normal  without pain or tenderness   Right lateral bend is Normal without pain or tenderness   Straight leg raising is Normal   Strength & tone: Normal   Stability overall normal stability  ;  The patient has been educated about the nature of the problem(s) and counseled on treatment options.  The patient appeared to understand what I have discussed and is in agreement with it.  Encounter Diagnoses  Name Primary?  . Right-sided low back pain with right-sided sciatica Yes  . Tobacco use disorder     PLAN Call if any problems.  Precautions discussed.  Continue current medications.   Return to clinic 2 weeks   Begin PT.  Electronically Signed Darreld McleanWayne Jailey Booton, MD 8/9/201710:04 AM

## 2016-05-04 ENCOUNTER — Encounter (HOSPITAL_COMMUNITY): Payer: Self-pay

## 2016-05-04 ENCOUNTER — Ambulatory Visit (HOSPITAL_COMMUNITY): Payer: No Typology Code available for payment source | Attending: Orthopaedic Surgery

## 2016-05-04 DIAGNOSIS — R262 Difficulty in walking, not elsewhere classified: Secondary | ICD-10-CM

## 2016-05-04 DIAGNOSIS — M25551 Pain in right hip: Secondary | ICD-10-CM | POA: Insufficient documentation

## 2016-05-04 DIAGNOSIS — R203 Hyperesthesia: Secondary | ICD-10-CM | POA: Insufficient documentation

## 2016-05-04 NOTE — Therapy (Signed)
[facility] [address] [place], [place], [address] Phone: [phone]   Fax:  [phone]  Physical Therapy Evaluation  Patient Details  Name: Jonathan Booth MRN: [id number] Date of Birth: Jun 29, 1971 Referring Provider: Scherrie MerrittsWaybe Keeling   Encounter Date: 05/04/2016      PT End of Session - 05/04/16 2053    Visit Number 1   Number of Visits 16   Date for PT Re-Evaluation 06/04/16   Authorization Time Period Benefits coverage ends 06/06/16   Authorization - Visit Number 1   PT Start Time 1033   PT Stop Time 1115   PT Time Calculation (min) 42 min   Activity Tolerance Patient limited by pain   Behavior During Therapy Anxious;WFL for tasks assessed/performed      Past Medical History:  Diagnosis Date  . Anxiety   . Back pain   . Depression   . GERD (gastroesophageal reflux disease)     No past surgical history on file.  There were no vitals filed for this visit.       Subjective Assessment - 05/04/16 1040    Subjective Pt was lifting a riding mower onto a truck 9 years ago, felt an immediate pop in his Right posterior buttock and fell to the ground in pain. His pain was fairly stable to 3-4 years, but he took a job with more physical requirements and noticed he was extremely limited with forward flexion activiities, and totally unable to squat. He reports if he squats, he begins to have numbness in bil LE after about 2-3 minutes, which will resolves after standing and moving around some.    Limitations Sitting   How long can you sit comfortably? 15-20 minutes   How long can you stand comfortably? 45-60 minutes, with limited RLE weight bearing    How long can you walk comfortably? 10-15 minutes (takes frequent breaks)    Diagnostic tests Lumbar Spine MRI    Patient Stated Goals resolve pain, return to funcitonal acivities.    Currently in Pain? Yes   Pain Score 4    Pain Location Hip   Pain Orientation Right   Pain Descriptors  / Indicators Throbbing   Pain Type Chronic pain   Pain Onset Other (comment)   Pain Frequency Other (Comment)   Aggravating Factors  weight bearing to R side seated or standing;    Pain Relieving Factors Lying on Left side fetal position with pillows between knees.    Effect of Pain on Daily Activities Unable top perform work and rest activities.             [facility] - 05/04/16 0001      Assessment   Medical Diagnosis R posterior hip pain    Referring Provider Scherrie MerrittsWaybe Keeling    Onset Date/Surgical Date --  9 years ago   Next MD Visit 05/18/16   Prior Therapy No PT, no chrio, no massage     Precautions   Precautions None     Restrictions   Weight Bearing Restrictions No     Balance Screen   Has the patient fallen in the past 6 months No   Has the patient had a decrease in activity level because of a fear of falling?  Yes   Is the patient reluctant to leave their home because of a fear of falling?  No     Prior Function   Level of Independence Independent  Needs some assistance with IADL  Vocation On disability  manual labor   Leisure hiking, hunting, fishing,      Sensation   Light Touch Appears Intact     ROM / Strength   AROM / PROM / Strength Strength     Strength   Strength Assessment Site Hip;Knee;Ankle   Right/Left Hip Right;Left   Right Hip Flexion 4-/5   Right Hip External Rotation  4-/5   Right Hip Internal Rotation 4-/5   Right Hip ABduction 4/5   Right Hip ADduction 4/5   Left Hip Flexion 4-/5   Left Hip External Rotation 4-/5   Left Hip Internal Rotation 4-/5   Left Hip ABduction 4/5   Right/Left Knee Right;Left   Right Knee Flexion 4-/5   Right Knee Extension 4/5   Left Knee Flexion 4-/5   Left Knee Extension 4/5   Right Ankle Dorsiflexion 4-/5   Left Ankle Dorsiflexion 4-/5     Flexibility   Soft Tissue Assessment /Muscle Length --     Palpation   Spinal mobility Lumbar/thoracic hypomobility and allodynia   Palpation  comment most dorsal palpation perceived as discomfort and pain.                            PT Education - 05/04/16 2048    Education provided Yes   Education Details Explained central sensitization and how the first step in his treatment will be to reteach his brain that normal movement and sensation are not painful.    Person(s) Educated Patient   Methods Explanation   Comprehension Verbalized understanding          PT Short Term Goals - 05/04/16 2119      PT SHORT TERM GOAL #1   Title After 2 weeks patient will demonstrate independence/compliance in HEP.   Status New     PT SHORT TERM GOAL #2   Title After 4 weeks patient will demonstrate improved narrow stance gross rotation >90 degrees bil to improve improved ergonomics of functional mobility.    Status New     PT SHORT TERM GOAL #3   Title After 4 weeks, pt will demonstrate improved tolerance of ambulation>20 minutes to improve independence in IADL.            PT Long Term Goals - 05/04/16 2125      PT LONG TERM GOAL #1   Title After 7 weeks, pt will demonstrate independence in advanced HEP to further progress in function after DC.    Status New     PT LONG TERM GOAL #2   Title After 7 weeks, pt will demonstrate improve tolerance to lifting loads <25lbs without exacerbation of pain to improve ability to gait employment.    Status New     PT LONG TERM GOAL #3   Title After 7 weeks, pt will demonstrate ability to AMB>40 minutes without exacerbation of pain to improve quality of life.    Status New               Plan - 05/04/16 2055    Clinical Impression Statement Pt presenting after exacerbation of acute on chronic posterior hip pain; additionally with reports of total LE numbeness after prolonged squating/bending over. Pt demonstrate hypomobility thorughout the lumbar/thoracic spine, and the hip joints bilat. Pt demonstrating impaired strength, and percieved pain/discomfort to  non-painfful stimuli characteristic of central sensitization. Pt complains of a throbbing pain sensation that presents after repeated forward flexion. The patient  reports decreased tolerance to R hip weight bearing in sitting, and RLE weight bearing in stance/AMB, as they aggravate throbbing R posterior hip pain. Allodynic response during examination creates limitations to in depth assessment of focal soft tissue structures. Medical record demonstrates numerous lumbar imaging events, but I suspect his case warrants close assessment of the pelvis and hip; however due to    Rehab Potential Fair   Clinical Impairments Affecting Rehab Potential chronicity of pain; central sensitization   PT Frequency 2x / week   PT Duration 8 weeks   PT Treatment/Interventions Electrical Stimulation;Gait training;Functional mobility training;Therapeutic activities;Therapeutic exercise;Patient/family education;Neuromuscular re-education;Dry needling;Passive range of motion;Manual techniques   PT Next Visit Plan Review Goals; Establish/educate HEP.    Consulted and Agree with Plan of Care Patient      Patient will benefit from skilled therapeutic intervention in order to improve the following deficits and impairments:  Abnormal gait, Decreased activity tolerance, Decreased strength, Decreased mobility, Decreased range of motion, Difficulty walking, Hypomobility, Postural dysfunction, Pain  Visit Diagnosis: Pain in right hip - Plan: PT plan of care cert/re-cert  Difficulty in walking, not elsewhere classified - Plan: PT plan of care cert/re-cert  Hyperesthesia - Plan: PT plan of care cert/re-cert     Problem List Patient Active Problem List   Diagnosis Date Noted  . Depression 01/31/2016  . Hyperlipidemia 09/29/2015  . Chronic back pain 09/29/2015  . Esophageal reflux 08/19/2015  . Cigarette nicotine dependence, uncomplicated 08/19/2015   9:35 PM, 05/04/16 Rosamaria LintsAllan C Kobee Medlen, PT, DPT Physical Therapist at  [facility] [phone] (office)     [facility] [address] [place], [place], [address] Phone: [phone]   Fax:  [phone]  Name: Jonathan Booth MRN: [id number] Date of Birth: 1971/03/05

## 2016-05-05 ENCOUNTER — Telehealth: Payer: Self-pay | Admitting: Orthopaedic Surgery

## 2016-05-05 ENCOUNTER — Ambulatory Visit (HOSPITAL_COMMUNITY): Payer: No Typology Code available for payment source

## 2016-05-05 ENCOUNTER — Encounter (HOSPITAL_COMMUNITY): Payer: No Typology Code available for payment source

## 2016-05-05 DIAGNOSIS — M25551 Pain in right hip: Secondary | ICD-10-CM

## 2016-05-05 DIAGNOSIS — R203 Hyperesthesia: Secondary | ICD-10-CM

## 2016-05-05 DIAGNOSIS — R262 Difficulty in walking, not elsewhere classified: Secondary | ICD-10-CM

## 2016-05-05 MED ORDER — HYDROCODONE-ACETAMINOPHEN 5-325 MG PO TABS: 1.0000 | ORAL_TABLET | ORAL | 0 refills | Status: DC | PRN

## 2016-05-05 NOTE — Telephone Encounter (Signed)
Patient requests refill of Hydrocodone/Acetaminophen 5-325 mgs.  Qty  60  Sig: Take 1 tablet by mouth every 4 (four) hours as needed for moderate pain (Must last 15 days.Do not take and drive a car or use machinery.).

## 2016-05-05 NOTE — Patient Instructions (Addendum)
  Piriformis Soft Tissue Mobilization    Place tennis ball between piriformis muscle and wall and roll forward and backwards along piriformis muscle.  X 5- 10 minutes   SEATED HAMSTRING STRETCH  While seated, rest your heel on the floor with your knee straight and gently lean forward until a stretch is felt behind your knee/thigh.  2 x 30 seconds each leg 3x's per day.       Laying Prone x 5 minutes

## 2016-05-05 NOTE — Therapy (Signed)
[facility] [address] [place], [place], [address] Phone: [phone]   Fax:  [phone]  Physical Therapy Treatment  Patient Details  Name: Jonathan RhymesDale C Guettler MRN: [id number] Date of Birth: 1971-04-22 Referring Provider: Scherrie MerrittsWaybe Keeling  Encounter Date: 05/05/2016      PT End of Session - 05/05/16 1711    Visit Number 2   Number of Visits 16   Date for PT Re-Evaluation 06/04/16   Authorization Time Period Benefits coverage ends 06/06/16   Authorization - Visit Number 2   PT Start Time 1308  Arrived at wrong time for appointment, so picked up this tx.  Time required for chart review.    PT Stop Time 1346   PT Time Calculation (min) 38 min   Activity Tolerance Patient limited by pain   Behavior During Therapy Anxious      Past Medical History:  Diagnosis Date  . Anxiety   . Back pain   . Depression   . GERD (gastroesophageal reflux disease)     No past surgical history on file.  There were no vitals filed for this visit.      Subjective Assessment - 05/05/16 1314    Subjective Pt states he went fishing yesterday evening, and pain increased when he was relaxed at home.  No falls or close calls.    Limitations Sitting   How long can you sit comfortably? 15-20 minutes   How long can you stand comfortably? 45-60 minutes, with limited RLE weight bearing    How long can you walk comfortably? 10-15 minutes (takes frequent breaks)    Diagnostic tests Lumbar Spine MRI    Patient Stated Goals resolve pain, return to funcitonal acivities.    Pain Score 4    Pain Location Hip   Pain Orientation Right   Pain Descriptors / Indicators Aching;Burning   Pain Type Chronic pain   Pain Onset More than a month ago   Pain Frequency Constant   Aggravating Factors  increased pain with increased activity, and weight bearing to R side seated or standing.    Pain Relieving Factors lying on L side in fetal position with pillows between knees.    Effect of Pain on Daily Activities Unable to perform work duties and rest activities.             [facility] - 05/05/16 0001      Assessment   Medical Diagnosis R posterior hip pain    Referring Provider Scherrie MerrittsWaybe Keeling   Onset Date/Surgical Date --  9 yrs ago   Next MD Visit 05/18/16   Prior Therapy No PT, no chrio, no massage                     [facility] Adult PT Treatment/Exercise - 05/05/16 0001      Lumbar Exercises: Stretches   Active Hamstring Stretch 2 reps;30 seconds  seated with leg out straight, cues for posture, and breathin     Lumbar Exercises: Seated   Other Seated Lumbar Exercises pelvic tilt x 10 reps with vc's for technique.      Lumbar Exercises: Prone   Other Prone Lumbar Exercises prone laying x 2 minutes     Manual Therapy   Manual Therapy Soft tissue mobilization   Manual therapy comments Performed separate to all other therapeutic activities   Soft tissue mobilization Piriformis in sidelying with trigger point release, and use of rolling stick used on piriformis and ITB.  PT Education - 05/05/16 1710    Education provided Yes   Education Details Educated on desensitization, as well as trigger point release, and importaince to regain proper body mechanics and ability to maintain midline.    Person(s) Educated Patient   Methods Explanation;Handout   Comprehension Verbalized understanding;Need further instruction;Tactile cues required;Verbal cues required          PT Short Term Goals - 05/05/16 1310      PT SHORT TERM GOAL #1   Title After 2 weeks patient will demonstrate independence/compliance in HEP.   Time 2   Period Weeks   Status New     PT SHORT TERM GOAL #2   Title After 4 weeks patient will demonstrate improved narrow stance gross rotation >90 degrees bil to improve improved ergonomics of functional mobility.    Time 4   Period Weeks   Status New     PT SHORT TERM GOAL #3   Title After 4  weeks, pt will demonstrate improved tolerance of ambulation>20 minutes to improve independence in IADL.    Time 4   Period Weeks   Status New           PT Long Term Goals - 05/05/16 1312      PT LONG TERM GOAL #1   Title After 7 weeks, pt will demonstrate independence in advanced HEP to further progress in function after DC.    Time 7   Period Weeks   Status New     PT LONG TERM GOAL #2   Title After 7 weeks, pt will demonstrate improve tolerance to lifting loads <25lbs without exacerbation of pain to improve ability to gait employment.   Time 7   Period Weeks   Status New     PT LONG TERM GOAL #3   Title After 7 weeks, pt will demonstrate ability to AMB>40 minutes without exacerbation of pain to improve quality of life.    Time 7   Period Weeks   Status New               Plan - 05/05/16 1712    Clinical Impression Statement Pt continues to have significant muscle guarding which limits hip and trunk mobility.  Continue to progress stretching as well as manual therapy as pt is able to tolerate.  At the end of the tx session pt was able to demonstrate improved midline posture, but continues to lean and favor the right.     Rehab Potential Fair   Clinical Impairments Affecting Rehab Potential chronicity of pain; central sensitization   PT Frequency 2x / week   PT Duration 8 weeks   PT Treatment/Interventions Electrical Stimulation;Gait training;Functional mobility training;Therapeutic activities;Therapeutic exercise;Patient/family education;Neuromuscular re-education;Dry needling;Passive range of motion;Manual techniques   PT Next Visit Plan review HEP, continue with desensitization, and manual.  Encourage breathing techniques.   PT Home Exercise Plan given   Consulted and Agree with Plan of Care Patient      Patient will benefit from skilled therapeutic intervention in order to improve the following deficits and impairments:  Abnormal gait, Decreased activity  tolerance, Decreased strength, Decreased mobility, Decreased range of motion, Difficulty walking, Hypomobility, Postural dysfunction, Pain  Visit Diagnosis: Pain in right hip  Difficulty in walking, not elsewhere classified  Hyperesthesia     Problem List Patient Active Problem List   Diagnosis Date Noted  . Depression 01/31/2016  . Hyperlipidemia 09/29/2015  . Chronic back pain 09/29/2015  . Esophageal reflux 08/19/2015  .  Cigarette nicotine dependence, uncomplicated 08/19/2015    Beth Catlynn Grondahl, PT, DPT X: [phone]   [facility] [address] , [place], [address] Phone: [phone]   Fax:  [phone]  Name: Jonathan RhymesDale C Oplinger MRN: [id number] Date of Birth: Feb 16, 1971

## 2016-05-06 MED ORDER — HYDROCODONE-ACETAMINOPHEN 5-325 MG PO TABS: 1.0000 | ORAL_TABLET | ORAL | 0 refills | Status: DC | PRN

## 2016-05-11 ENCOUNTER — Ambulatory Visit (HOSPITAL_COMMUNITY): Payer: No Typology Code available for payment source

## 2016-05-11 ENCOUNTER — Encounter (HOSPITAL_COMMUNITY): Payer: No Typology Code available for payment source | Admitting: Physical Therapy

## 2016-05-11 DIAGNOSIS — R262 Difficulty in walking, not elsewhere classified: Secondary | ICD-10-CM

## 2016-05-11 DIAGNOSIS — M25551 Pain in right hip: Secondary | ICD-10-CM

## 2016-05-11 DIAGNOSIS — R203 Hyperesthesia: Secondary | ICD-10-CM

## 2016-05-11 NOTE — Therapy (Signed)
[facility] [address] [place], [place], [address] Phone: [phone]   Fax:  [phone]  Physical Therapy Treatment  Patient Details  Name: Jonathan RhymesDale C Booth MRN: [id number] Date of Birth: September 11, 1971 Referring Provider: Scherrie MerrittsWaybe Keeling  Encounter Date: 05/11/2016      PT End of Session - 05/11/16 1410    Visit Number 3   Number of Visits 16   Date for PT Re-Evaluation 06/04/16   Authorization Time Period Benefits coverage ends 06/06/16   Authorization - Visit Number 3   PT Start Time 1349   PT Stop Time 1427   PT Time Calculation (min) 38 min   Activity Tolerance Patient limited by pain   Behavior During Therapy Anxious;WFL for tasks assessed/performed      Past Medical History:  Diagnosis Date  . Anxiety   . Back pain   . Depression   . GERD (gastroesophageal reflux disease)     No past surgical history on file.  There were no vitals filed for this visit.      Subjective Assessment - 05/11/16 1352    Subjective Pt said he is doing alright today, everything is about the same as far as symptoms. [facility] he reports some severe increases in pain in the R buttock area with radiation and numbness of BLE and thighs.                          [facility] Adult PT Treatment/Exercise - 05/11/16 0001      Exercises   Exercises Lumbar     Lumbar Exercises: Aerobic   Stationary Bike NuStep x 5 minutes     Lumbar Exercises: Seated   Other Seated Lumbar Exercises Seated sciatic nerve glides  Sciatic tension with trunk and RLE excursion, does not abate   Other Seated Lumbar Exercises Seated Chair trunk rotation stretch   3x30sec bilat     Lumbar Exercises: Supine   Other Supine Lumbar Exercises Supine traction posture, feet in chair, lumbar towel roll, hot pack under bil PSIS 20x   Other Supine Lumbar Exercises Supine traction posture  bilat ankle pumps                  PT Short Term  Goals - 05/05/16 1310      PT SHORT TERM GOAL #1   Title After 2 weeks patient will demonstrate independence/compliance in HEP.   Time 2   Period Weeks   Status New     PT SHORT TERM GOAL #2   Title After 4 weeks patient will demonstrate improved narrow stance gross rotation >90 degrees bil to improve improved ergonomics of functional mobility.    Time 4   Period Weeks   Status New     PT SHORT TERM GOAL #3   Title After 4 weeks, pt will demonstrate improved tolerance of ambulation>20 minutes to improve independence in IADL.    Time 4   Period Weeks   Status New           PT Long Term Goals - 05/05/16 1312      PT LONG TERM GOAL #1   Title After 7 weeks, pt will demonstrate independence in advanced HEP to further progress in function after DC.    Time 7   Period Weeks   Status New     PT LONG TERM GOAL #2   Title After 7 weeks, pt will demonstrate  improve tolerance to lifting loads <25lbs without exacerbation of pain to improve ability to gait employment.   Time 7   Period Weeks   Status New     PT LONG TERM GOAL #3   Title After 7 weeks, pt will demonstrate ability to AMB>40 minutes without exacerbation of pain to improve quality of life.    Time 7   Period Weeks   Status New               Plan - 05/11/16 1413    Clinical Impression Statement Pt with limited tolerance of session today. He demonstrates guarding with all trunk/BLE movement as well as some R sciatic districbutuion pulling. Attempts to perform Sciatic gliding are unsuccessful. Pt demonstrating severe tightness and hypomobility around the R hip in most planes with limited. Pt continues to complain of throbbing pain near the R posterior pelvis, as well as the porximal posterior sacrum while supine.    Rehab Potential Fair   Clinical Impairments Affecting Rehab Potential chronicity of pain; central sensitization   PT Frequency 2x / week   PT Duration 8 weeks   PT Treatment/Interventions  Electrical Stimulation;Gait training;Functional mobility training;Therapeutic activities;Therapeutic exercise;Patient/family education;Neuromuscular re-education;Dry needling;Passive range of motion;Manual techniques   PT Next Visit Plan continue with desensitization. Encourage breathing techniques. Avoid aggressive manual techniques. focus on reintegration of normalized movement.    Consulted and Agree with Plan of Care Patient      Patient will benefit from skilled therapeutic intervention in order to improve the following deficits and impairments:  Abnormal gait, Decreased activity tolerance, Decreased strength, Decreased mobility, Decreased range of motion, Difficulty walking, Hypomobility, Postural dysfunction, Pain  Visit Diagnosis: Pain in right hip  Difficulty in walking, not elsewhere classified  Hyperesthesia     Problem List Patient Active Problem List   Diagnosis Date Noted  . Depression 01/31/2016  . Hyperlipidemia 09/29/2015  . Chronic back pain 09/29/2015  . Esophageal reflux 08/19/2015  . Cigarette nicotine dependence, uncomplicated 08/19/2015    2:25 PM, 05/11/16 Jonathan LintsAllan C Buccola, PT, DPT Physical Therapist at [facility] [phone] (office)     [facility] [address] [place], [place], [address] Phone: [phone]   Fax:  [phone]  Name: Jonathan RhymesDale C Rausch MRN: [id number] Date of Birth: Apr 19, 1971

## 2016-05-13 ENCOUNTER — Ambulatory Visit (INDEPENDENT_AMBULATORY_CARE_PROVIDER_SITE_OTHER): Payer: No Typology Code available for payment source | Admitting: Orthopaedic Surgery

## 2016-05-13 ENCOUNTER — Encounter: Payer: Self-pay | Admitting: Orthopaedic Surgery

## 2016-05-13 VITALS — BP 130/78 | HR 55 | Ht 73.25 in | Wt 200.0 lb

## 2016-05-13 DIAGNOSIS — F17200 Nicotine dependence, unspecified, uncomplicated: Secondary | ICD-10-CM

## 2016-05-13 DIAGNOSIS — M5441 Lumbago with sciatica, right side: Secondary | ICD-10-CM

## 2016-05-13 MED ORDER — PREDNISONE 5 MG (21) PO TBPK: ORAL_TABLET | ORAL | 0 refills | Status: DC

## 2016-05-13 MED ORDER — HYDROCODONE-ACETAMINOPHEN 5-325 MG PO TABS: 1.0000 | ORAL_TABLET | ORAL | 0 refills | Status: DC | PRN

## 2016-05-13 NOTE — Patient Instructions (Signed)
Continue therapy.  Precautions discussed. 

## 2016-05-13 NOTE — Progress Notes (Signed)
Patient ZO:XWRU:Jonathan Booth, male DOB:1971/07/03, 45 y.o. EAV:[id number]  Chief Complaint  Patient presents with  . Follow-up    back pain    HPI  Jonathan Booth is a 45 y.o. male who has chronic lower back pain.  He has been going to PT.  He has an area of the right buttocks that is more tender.  He is doing his exercises.  He has right sided sciatica that is better. MRI previously showed no HNP.  I have reviewed the PT notes.  I will give prednisone dose pack.  He is to stop Naprosyn while taking the prednisone. HPI  Body mass index is 26.21 kg/m.  ROS  Review of Systems  HENT: Negative for congestion.   Respiratory: Negative for cough and shortness of breath.   Cardiovascular: Negative for chest pain and leg swelling.  Endocrine: Negative for cold intolerance.  Musculoskeletal: Positive for arthralgias and back pain.  Allergic/Immunologic: Negative for environmental allergies.  Psychiatric/Behavioral: The patient is nervous/anxious.     Past Medical History:  Diagnosis Date  . Anxiety   . Back pain   . Depression   . GERD (gastroesophageal reflux disease)     No past surgical history on file.  Family History  Problem Relation Age of Onset  . Heart disease Father   . Hypertension Father   . Cirrhosis Mother     Social History Social History  Substance Use Topics  . Smoking status: Current Every Day Smoker    Packs/day: 0.50    Years: 30.00    Types: Cigarettes  . Smokeless tobacco: Never Used  . Alcohol use No    No Known Allergies  Current Outpatient Prescriptions  Medication Sig Dispense Refill  . atorvastatin (LIPITOR) 10 MG tablet Take 1 tablet (10 mg total) by mouth daily. 90 tablet 1  . busPIRone (BUSPAR) 5 MG tablet Take 5 mg by mouth 3 (three) times daily.    Marland Kitchen. HYDROcodone-acetaminophen (NORCO/VICODIN) 5-325 MG tablet Take 1 tablet by mouth every 4 (four) hours as needed for moderate pain (Must last 15 days.Do not take and drive a car or use  machinery.). 60 tablet 0  . naproxen (NAPROSYN) 500 MG tablet Take 1 tablet (500 mg total) by mouth 2 (two) times daily with a meal. 60 tablet 5  . predniSONE (STERAPRED UNI-PAK 21 TAB) 5 MG (21) TBPK tablet Take 6 pills first day; 5 pills second day; 4 pills third day; 3 pills fourth day; 2 pills next day and 1 pill last day. 21 tablet 0  . tiZANidine (ZANAFLEX) 4 MG tablet One by mouth every 8 hours as needed for spasm 90 tablet 3   No current facility-administered medications for this visit.      Physical Exam  Blood pressure 130/78, pulse (!) 55, height 6' 1.25" (1.861 m), weight 200 lb (90.7 kg).  Constitutional: overall normal hygiene, normal nutrition, well developed, normal grooming, normal body habitus. Assistive device:none  Musculoskeletal: gait and station Limp none, muscle tone and strength are normal, no tremors or atrophy is present.  .  Neurological: coordination overall normal.  Deep tendon reflex/nerve stretch intact.  Sensation normal.  Cranial nerves II-XII intact.   Skin:   normal overall no scars, lesions, ulcers or rashes. No psoriasis.  Psychiatric: Alert and oriented x 3.  Recent memory intact, remote memory unclear.  Normal mood and affect. Well groomed.  Good eye contact.  Cardiovascular: overall no swelling, no varicosities, no edema bilaterally, normal temperatures of the  legs and arms, no clubbing, cyanosis and good capillary refill.  Lymphatic: palpation is normal.  Spine/Pelvis examination:  Inspection:  Overall, sacoiliac joint benign and hips nontender; without crepitus or defects.   Thoracic spine inspection: Alignment normal without kyphosis present   Lumbar spine inspection:  Alignment  with normal lumbar lordosis, without scoliosis apparent.   Thoracic spine palpation:  without tenderness of spinal processes   Lumbar spine palpation: with tenderness of lumbar area; without tightness of lumbar muscles    Range of Motion:   Lumbar flexion,  forward flexion is 45 without pain or tenderness    Lumbar extension is 10 without pain or tenderness   Left lateral bend is Normal  without pain or tenderness   Right lateral bend is Normal without pain or tenderness   Straight leg raising is Normal   Strength & tone: Normal   Stability overall normal stability   He has some tenderness of right upper buttocks without defect present.  The patient has been educated about the nature of the problem(s) and counseled on treatment options.  The patient appeared to understand what I have discussed and is in agreement with it.  Encounter Diagnoses  Name Primary?  . Right-sided low back pain with right-sided sciatica Yes  . Tobacco use disorder     PLAN Call if any problems.  Precautions discussed.  Continue current medications.   Return to clinic 2 weeks  Electronically Signed Darreld McleanWayne Jarita Raval, MD 8/23/201710:04 AM

## 2016-05-14 ENCOUNTER — Ambulatory Visit (HOSPITAL_COMMUNITY): Payer: No Typology Code available for payment source | Admitting: Physical Therapy

## 2016-05-14 DIAGNOSIS — R262 Difficulty in walking, not elsewhere classified: Secondary | ICD-10-CM

## 2016-05-14 DIAGNOSIS — M25551 Pain in right hip: Secondary | ICD-10-CM

## 2016-05-14 NOTE — Therapy (Signed)
Jonathan Booth [address] [place], [place], [address] Phone: [phone]   Fax:  [phone]  Physical Therapy Treatment  Patient Details  Name: Jonathan Booth MRN: [id number] Date of Birth: Apr 08, 1971 Referring Provider: Scherrie MerrittsWaybe Keeling  Encounter Date: 05/14/2016      PT End of Session - 05/14/16 1459    Visit Number 4   Number of Visits 16   Date for PT Re-Evaluation 06/04/16   Authorization Time Period Benefits coverage ends 06/06/16   Authorization - Visit Number 4   PT Start Time 1436   PT Stop Time 1514   PT Time Calculation (min) 38 min   Activity Tolerance Patient limited by pain   Behavior During Therapy Anxious;WFL for tasks assessed/performed      Past Medical History:  Diagnosis Date  . Anxiety   . Back pain   . Depression   . GERD (gastroesophageal reflux disease)     No past surgical history on file.  There were no vitals filed for this visit.      Subjective Assessment - 05/14/16 1440    Subjective Pt states nothing has helped so far.  STM increased his pain, moist heat increased his pain and any repetitive motion makes it worse.   Currently in Pain? Yes   Pain Score 8    Pain Location Hip   Pain Orientation Right   Pain Descriptors / Indicators Aching;Burning   Pain Type Chronic pain                         [facility] Adult PT Treatment/Exercise - 05/14/16 0001      Lumbar Exercises: Stretches   Active Hamstring Stretch 2 reps;30 seconds     Lumbar Exercises: Supine   Clam 5 reps   Bridge 10 reps   Straight Leg Raise 5 reps     Lumbar Exercises: Prone   Straight Leg Raise 5 reps   Straight Leg Raises Limitations hamstring curls 5 reps each   Other Prone Lumbar Exercises prone laying x 2 minutes   Other Prone Lumbar Exercises press ups 5 reps X 2 sets                  PT Short Term Goals - 05/05/16 1310      PT SHORT TERM GOAL #1   Title After 2 weeks patient will  demonstrate independence/compliance in HEP.   Time 2   Period Weeks   Status New     PT SHORT TERM GOAL #2   Title After 4 weeks patient will demonstrate improved narrow stance gross rotation >90 degrees bil to improve improved ergonomics of functional mobility.    Time 4   Period Weeks   Status New     PT SHORT TERM GOAL #3   Title After 4 weeks, pt will demonstrate improved tolerance of ambulation>20 minutes to improve independence in IADL.    Time 4   Period Weeks   Status New           PT Long Term Goals - 05/05/16 1312      PT LONG TERM GOAL #1   Title After 7 weeks, pt will demonstrate independence in advanced HEP to further progress in function after DC.    Time 7   Period Weeks   Status New     PT LONG TERM GOAL #2   Title After 7 weeks, pt will demonstrate improve tolerance to  lifting loads <25lbs without exacerbation of pain to improve ability to gait employment.   Time 7   Period Weeks   Status New     PT LONG TERM GOAL #3   Title After 7 weeks, pt will demonstrate ability to AMB>40 minutes without exacerbation of pain to improve quality of life.    Time 7   Period Weeks   Status New               Plan - 05/14/16 1501    Clinical Impression Statement Pt with pain voiced with all movements and all exercises.  continuous pain behaviors observed with no particular pattern that seemed  to help or worsen pain. slow, cautious and painful mobility noted during session.  Verbal cues required with all exercises to no hold breath with all exercises.  Pt wtih severe pain trying to complete supine bridge 5 reps but no difficuilty standing without UE assist from edge of mat 5 reps.  Pt also observed exiting clinic with antalgia until reaching parking lot in which he no longer presented with antalgic gait or had any difficulties getting into his car.    Rehab Potential Fair   Clinical Impairments Affecting Rehab Potential chronicity of pain; central sensitization    PT Frequency 2x / week   PT Duration 8 weeks   PT Treatment/Interventions Electrical Stimulation;Gait training;Functional mobility training;Therapeutic activities;Therapeutic exercise;Patient/family education;Neuromuscular re-education;Dry needling;Passive range of motion;Manual techniques   PT Next Visit Plan continue with desensitization. Encourage breathing techniques. Avoid aggressive manual techniques. focus on reintegration of normalized movement.    Consulted and Agree with Plan of Care Patient      Patient will benefit from skilled therapeutic intervention in order to improve the following deficits and impairments:  Abnormal gait, Decreased activity tolerance, Decreased strength, Decreased mobility, Decreased range of motion, Difficulty walking, Hypomobility, Postural dysfunction, Pain  Visit Diagnosis: Pain in right hip  Difficulty in walking, not elsewhere classified     Problem List Patient Active Problem List   Diagnosis Date Noted  . Depression 01/31/2016  . Hyperlipidemia 09/29/2015  . Chronic back pain 09/29/2015  . Esophageal reflux 08/19/2015  . Cigarette nicotine dependence, uncomplicated 08/19/2015    Lurena Nida, PTA/CLT [phone] 05/14/2016, 4:01 PM  [facility] [address] [place], [place], [address] Phone: [phone]   Fax:  [phone]  Name: Jonathan Booth MRN: [id number] Date of Birth: [birth year]

## 2016-05-19 ENCOUNTER — Ambulatory Visit (HOSPITAL_COMMUNITY): Payer: No Typology Code available for payment source

## 2016-05-19 DIAGNOSIS — R262 Difficulty in walking, not elsewhere classified: Secondary | ICD-10-CM

## 2016-05-19 DIAGNOSIS — M25551 Pain in right hip: Secondary | ICD-10-CM

## 2016-05-19 DIAGNOSIS — R203 Hyperesthesia: Secondary | ICD-10-CM

## 2016-05-19 NOTE — Therapy (Addendum)
PHYSICAL THERAPY DISCHARGE SUMMARY  Visits from Start of Care: 6 Pt has not return to therapy and has not contacted office, nor returned phone calls. Pt will be DC back to referring provider.  Current functional level related to goals / functional outcomes: Patient has made no progress. After six session, PT has been unable to find any means to abate pt's pain or improve functional tolerance.    Remaining deficits: Same as at evaluation. No change.    Education / Equipment: *see below  Plan:                                                    Patient goals were not met. Patient is being discharged due to not returning since the last visit.  ?????        [facility] [address] [place], [place], [address] Phone: [phone]   Fax:  [phone] 5:01 PM, 06/02/16 Allan C Buccola, PT, DPT Physical Therapist at [facility] [phone] (office)     Physical Therapy Treatment  Patient Details  Name: Jonathan Booth MRN: [id number] Date of Birth: 11/20/1970 Referring Provider: Waybe Keeling  Encounter Date: 05/19/2016      PT End of Session - 05/19/16 1508    Visit Number 6   Number of Visits 16   Date for PT Re-Evaluation 06/04/16   Authorization Time Period Benefits coverage ends 06/06/16   Authorization - Visit Number 6   PT Start Time 1451   PT Stop Time 1515   PT Time Calculation (min) 24 min   Activity Tolerance Patient limited by pain;Patient tolerated treatment well   Behavior During Therapy Anxious;WFL for tasks assessed/performed      Past Medical History:  Diagnosis Date  . Anxiety   . Back pain   . Depression   . GERD (gastroesophageal reflux disease)     No past surgical history on file.  There were no vitals filed for this visit.      Subjective Assessment - 05/19/16 1453    Currently in Pain? Yes   Pain Score 7    Pain Location --  Burning pain in R buttocks   Pain  Orientation Right                         [facility] Adult PT Treatment/Exercise - 05/19/16 0001      Lumbar Exercises: Stretches   Active Hamstring Stretch Other (comment)  supine LAQ, hip @ 90* 10x3s bilat     Lumbar Exercises: Aerobic   Stationary Bike NuStep x16 minutes  WU prior to charged time; no arms due to acute shoulder pain     Lumbar Exercises: Standing   Other Standing Lumbar Exercises Repeated extension in standing: pelvis to wall 15x     Lumbar Exercises: Supine   Bridge 10 reps;Other (comment)  2x10; knees at 90*    Other Supine Lumbar Exercises Supine Deep Squat Stretch   3x30s c PROM nerve glide between.                   PT Short Term Goals - 05/05/16 1310      PT SHORT TERM GOAL #1   Title After 2 weeks patient will demonstrate independence/compliance in HEP.   Time   2   Period Weeks   Status New     PT SHORT TERM GOAL #2   Title After 4 weeks patient will demonstrate improved narrow stance gross rotation >90 degrees bil to improve improved ergonomics of functional mobility.    Time 4   Period Weeks   Status New     PT SHORT TERM GOAL #3   Title After 4 weeks, pt will demonstrate improved tolerance of ambulation>20 minutes to improve independence in IADL.    Time 4   Period Weeks   Status New           PT Long Term Goals - 05/05/16 1312      PT LONG TERM GOAL #1   Title After 7 weeks, pt will demonstrate independence in advanced HEP to further progress in function after DC.    Time 7   Period Weeks   Status New     PT LONG TERM GOAL #2   Title After 7 weeks, pt will demonstrate improve tolerance to lifting loads <25lbs without exacerbation of pain to improve ability to gait employment.   Time 7   Period Weeks   Status New     PT LONG TERM GOAL #3   Title After 7 weeks, pt will demonstrate ability to AMB>40 minutes without exacerbation of pain to improve quality of life.    Time 7   Period Weeks   Status New                Plan - 05/19/16 1509    Clinical Impression Statement Pt tolerance to session is difficult to assess at this time. Pt demonstrates antalgic breathing patterns with intermittent and frequent valsalva, apnea, etc, however connecting pain reponse to activites is difficult as pain never clearly is acutely worsened by activities, as everything just hurts maximally bad all the time whether moving or being still. Vital signs taken during most painful parts of session reveal BP: 116/83 mmHg and HR 66 bpm. Pt appears to have more pain during supine LLE and RLE PROM than when weight wearing or walking. He continues to demonstrate very limited weight bearing on the R posterior sacrum both in supine and in sitting.     Rehab Potential Fair   Clinical Impairments Affecting Rehab Potential chronicity of pain; central sensitization   PT Frequency 2x / week   PT Duration 8 weeks   PT Treatment/Interventions Electrical Stimulation;Gait training;Functional mobility training;Therapeutic activities;Therapeutic exercise;Patient/family education;Neuromuscular re-education;Dry needling;Passive range of motion;Manual techniques   PT Next Visit Plan continue with desensitization. Encourage breathing techniques. Avoid aggressive manual techniques. focus on reintegration of normalized movement.    Consulted and Agree with Plan of Care Patient      Patient will benefit from skilled therapeutic intervention in order to improve the following deficits and impairments:  Abnormal gait, Decreased activity tolerance, Decreased strength, Decreased mobility, Decreased range of motion, Difficulty walking, Hypomobility, Postural dysfunction, Pain  Visit Diagnosis: Pain in right hip  Difficulty in walking, not elsewhere classified  Hyperesthesia     Problem List Patient Active Problem List   Diagnosis Date Noted  . Depression 01/31/2016  . Hyperlipidemia 09/29/2015  . Chronic back pain 09/29/2015  .  Esophageal reflux 08/19/2015  . Cigarette nicotine dependence, uncomplicated 08/19/2015    3:30 PM, 05/19/16 Allan C Buccola, PT, DPT Physical Therapist at [facility] [phone] (office)     [facility] [address] [place], [place],   [address] Phone: [phone]   Fax:  [phone]  Name: Josafat C Geralds MRN: [id number] Date of Birth: 07/17/1971    

## 2016-05-21 ENCOUNTER — Encounter (HOSPITAL_COMMUNITY): Payer: No Typology Code available for payment source

## 2016-05-26 ENCOUNTER — Ambulatory Visit (HOSPITAL_COMMUNITY): Payer: No Typology Code available for payment source | Attending: Orthopaedic Surgery

## 2016-05-27 ENCOUNTER — Encounter: Payer: Self-pay | Admitting: Orthopaedic Surgery

## 2016-05-27 ENCOUNTER — Ambulatory Visit (INDEPENDENT_AMBULATORY_CARE_PROVIDER_SITE_OTHER): Payer: No Typology Code available for payment source

## 2016-05-27 ENCOUNTER — Ambulatory Visit (INDEPENDENT_AMBULATORY_CARE_PROVIDER_SITE_OTHER): Payer: No Typology Code available for payment source | Admitting: Orthopaedic Surgery

## 2016-05-27 VITALS — BP 118/76 | HR 72 | Temp 97.9°F | Ht 73.0 in | Wt 201.0 lb

## 2016-05-27 DIAGNOSIS — M549 Dorsalgia, unspecified: Secondary | ICD-10-CM

## 2016-05-27 DIAGNOSIS — F17200 Nicotine dependence, unspecified, uncomplicated: Secondary | ICD-10-CM

## 2016-05-27 DIAGNOSIS — M25512 Pain in left shoulder: Secondary | ICD-10-CM

## 2016-05-27 DIAGNOSIS — G8929 Other chronic pain: Secondary | ICD-10-CM

## 2016-05-27 MED ORDER — HYDROCODONE-ACETAMINOPHEN 7.5-325 MG PO TABS: ORAL_TABLET | ORAL | 0 refills | Status: DC

## 2016-05-27 NOTE — Progress Notes (Signed)
Patient ZO:XWRU:Jonathan Booth, male DOB:1971/07/07, 45 y.o. EAV:[id number]  Chief Complaint  Patient presents with  . Follow-up    Low back pain, new problem left shoulder pain    HPI  Jonathan Booth is a 45 y.o. male who has lower back pain and now a new problem with the left shoulder.    His back is better and he has been to PT.  I have reviewed the PT notes.  He is making good progress.  He has no paresthesias, no new trauma to the back.  He hurt his left shoulder about ten days to two weeks ago.  He was using an air hammer.  He has had pain of the posterior shoulder and left upper arm.  He has been taking the pain medicine for his back, using ice, heat, rubs with no help.  He has no redness, no paresthesias.  He has difficulty sleeping. HPI  Body mass index is 26.52 kg/m.  ROS  Review of Systems  HENT: Negative for congestion.   Respiratory: Negative for cough and shortness of breath.   Cardiovascular: Negative for chest pain and leg swelling.  Endocrine: Negative for cold intolerance.  Musculoskeletal: Positive for arthralgias and back pain.  Allergic/Immunologic: Negative for environmental allergies.  Psychiatric/Behavioral: The patient is nervous/anxious.     Past Medical History:  Diagnosis Date  . Anxiety   . Back pain   . Depression   . GERD (gastroesophageal reflux disease)     History reviewed. No pertinent surgical history.  Family History  Problem Relation Age of Onset  . Heart disease Father   . Hypertension Father   . Cirrhosis Mother     Social History Social History  Substance Use Topics  . Smoking status: Current Every Day Smoker    Packs/day: 0.50    Years: 30.00    Types: Cigarettes  . Smokeless tobacco: Never Used  . Alcohol use No    No Known Allergies  Current Outpatient Prescriptions  Medication Sig Dispense Refill  . atorvastatin (LIPITOR) 10 MG tablet Take 1 tablet (10 mg total) by mouth daily. 90 tablet 1  . busPIRone (BUSPAR) 5 MG  tablet Take 5 mg by mouth 3 (three) times daily.    . naproxen (NAPROSYN) 500 MG tablet Take 1 tablet (500 mg total) by mouth 2 (two) times daily with a meal. 60 tablet 5  . predniSONE (STERAPRED UNI-PAK 21 TAB) 5 MG (21) TBPK tablet Take 6 pills first day; 5 pills second day; 4 pills third day; 3 pills fourth day; 2 pills next day and 1 pill last day. 21 tablet 0  . tiZANidine (ZANAFLEX) 4 MG tablet One by mouth every 8 hours as needed for spasm 90 tablet 3  . HYDROcodone-acetaminophen (NORCO) 7.5-325 MG tablet One every four hours for pain as needed.  Do not drive car or operate machinery while taking this medicine.  Must last 14 days. 56 tablet 0   No current facility-administered medications for this visit.      Physical Exam  Blood pressure 118/76, pulse 72, temperature 97.9 F (36.6 C), height 6\' 1"  (1.854 m), weight 201 lb (91.2 kg).  Constitutional: overall normal hygiene, normal nutrition, well developed, normal grooming, normal body habitus. Assistive device:none  Musculoskeletal: gait and station Limp none, muscle tone and strength are normal, no tremors or atrophy is present.  .  Neurological: coordination overall normal.  Deep tendon reflex/nerve stretch intact.  Sensation normal.  Cranial nerves II-XII intact.  Skin:   Normal overall no scars, lesions, ulcers or rashes. No psoriasis.  Psychiatric: Alert and oriented x 3.  Recent memory intact, remote memory unclear.  Normal mood and affect. Well groomed.  Good eye contact.  Cardiovascular: overall no swelling, no varicosities, no edema bilaterally, normal temperatures of the legs and arms, no clubbing, cyanosis and good capillary refill.  Lymphatic: palpation is normal.  Spine/Pelvis examination:  Inspection:  Overall, sacoiliac joint benign and hips nontender; without crepitus or defects.   Thoracic spine inspection: Alignment normal without kyphosis present   Lumbar spine inspection:  Alignment  with normal lumbar  lordosis, without scoliosis apparent.   Thoracic spine palpation:  without tenderness of spinal processes   Lumbar spine palpation: with tenderness of lumbar area; without tightness of lumbar muscles    Range of Motion:   Lumbar flexion, forward flexion is full without pain or tenderness    Lumbar extension is full without pain or tenderness   Left lateral bend is Normal  without pain or tenderness   Right lateral bend is Normal without pain or tenderness   Straight leg raising is Normal   Strength & tone: Normal   Stability overall normal stability   Examination of left Upper Extremity is done.  Inspection:   Overall:  Elbow non-tender without crepitus or defects, forearm non-tender without crepitus or defects, wrist non-tender without crepitus or defects, hand non-tender.    Shoulder: with glenohumeral joint tenderness, without effusion.   Upper arm: without swelling and tenderness   Range of motion:   Overall:  Full range of motion of the elbow, full range of motion of wrist and full range of motion in fingers.   Shoulder:  left  120 degrees forward flexion; 90 degrees abduction; 30 degrees internal rotation, 30 degrees external rotation, 15 degrees extension, 40 degrees adduction.   Stability:   Overall:  Shoulder, elbow and wrist stable   Strength and Tone:   Overall full shoulder muscles strength, full upper arm strength and normal upper arm bulk and tone.  X-rays were done of the left shoulder, reported separately.  The patient has been educated about the nature of the problem(s) and counseled on treatment options.  The patient appeared to understand what I have discussed and is in agreement with it.  Encounter Diagnoses  Name Primary?  . Left shoulder pain Yes  . Chronic back pain   . Tobacco use disorder    PROCEDURE NOTE:  The patient request injection, verbal consent was obtained.  The left shoulder was prepped appropriately after time out was performed.    Sterile technique was observed and injection of 1 cc of Depo-Medrol 40 mg with several cc's of plain xylocaine. Anesthesia was provided by ethyl chloride and a 20-gauge needle was used to inject the shoulder area. A posterior approach was used.  The injection was tolerated well.  A band aid dressing was applied.  The patient was advised to apply ice later today and tomorrow to the injection sight as needed.   PLAN Call if any problems.  Precautions discussed.  Continue current medications.   Return to clinic 2 weeks   Electronically Signed Darreld Mclean, MD 9/6/201711:04 AM

## 2016-05-27 NOTE — Patient Instructions (Signed)

## 2016-05-28 ENCOUNTER — Telehealth (HOSPITAL_COMMUNITY): Payer: Self-pay

## 2016-05-28 ENCOUNTER — Encounter (HOSPITAL_COMMUNITY): Payer: No Typology Code available for payment source

## 2016-05-28 NOTE — Telephone Encounter (Signed)
05/28/16 left a message to find out which time he will be coming on 9/12... He is on Allan's schedule for 5 different times.

## 2016-06-01 ENCOUNTER — Encounter (HOSPITAL_COMMUNITY): Payer: No Typology Code available for payment source

## 2016-06-01 ENCOUNTER — Telehealth (HOSPITAL_COMMUNITY): Payer: Self-pay

## 2016-06-01 NOTE — Telephone Encounter (Signed)
06/01/16 left a second message asking him to call us because he has 4 appts scehduled on the same day but different times.  Need to know which time he is planning to come.

## 2016-06-01 NOTE — Telephone Encounter (Signed)
9/11 3:52 left another message to see which time the patient was coming.

## 2016-06-02 ENCOUNTER — Ambulatory Visit (HOSPITAL_COMMUNITY): Payer: No Typology Code available for payment source

## 2016-06-02 ENCOUNTER — Ambulatory Visit (HOSPITAL_COMMUNITY): Payer: No Typology Code available for payment source | Admitting: Physical Therapy

## 2016-06-02 ENCOUNTER — Encounter (HOSPITAL_COMMUNITY): Payer: No Typology Code available for payment source

## 2016-06-02 ENCOUNTER — Telehealth (HOSPITAL_COMMUNITY): Payer: Self-pay | Admitting: Physical Therapy

## 2016-06-02 NOTE — Telephone Encounter (Signed)
Pt placed on schedule at 4 different times today, unsure why.  Did not make first 2 morning appointments so called again Training and development officer(secretary has called and left several messages for patient to call clinic) and left message to contact clinic regarding afternoon appointments.  Also told he had no more appointments after today and needed to get on the schedule to continue.   Lurena NidaAmy B Frazier, PTA/CLT [phone]

## 2016-06-04 ENCOUNTER — Encounter (HOSPITAL_COMMUNITY): Payer: No Typology Code available for payment source

## 2016-06-10 ENCOUNTER — Encounter: Payer: Self-pay | Admitting: Orthopaedic Surgery

## 2016-06-10 ENCOUNTER — Ambulatory Visit (INDEPENDENT_AMBULATORY_CARE_PROVIDER_SITE_OTHER): Payer: Self-pay | Admitting: Orthopaedic Surgery

## 2016-06-10 VITALS — BP 136/86 | HR 72 | Ht 73.0 in | Wt 197.0 lb

## 2016-06-10 DIAGNOSIS — M25512 Pain in left shoulder: Secondary | ICD-10-CM

## 2016-06-10 DIAGNOSIS — F17200 Nicotine dependence, unspecified, uncomplicated: Secondary | ICD-10-CM

## 2016-06-10 MED ORDER — HYDROCODONE-ACETAMINOPHEN 7.5-325 MG PO TABS: ORAL_TABLET | ORAL | 0 refills | Status: DC

## 2016-06-10 NOTE — Progress Notes (Signed)
Patient ZO:XWRU JADE Booth, male DOB:[birth year], 45 y.o. EAV:[id number]  Chief Complaint  Patient presents with  . Follow-up    Shoulder pain left    HPI  Jonathan Booth is a 45 y.o. male who has left shoulder pain.  His pain is decreased. It has moved from the scapula area and posterior shoulder to the left upper lateral arm now.  He is using his sling.  I have given exercises of the shoulder for him to do.  He has completed PT for his back and that is much improved.  He has cut back on smoking and saw his doctor and has patches now that he is trying.  I have congratulated him. HPI  Body mass index is 25.99 kg/m.  ROS  Review of Systems  HENT: Negative for congestion.   Respiratory: Negative for cough and shortness of breath.   Cardiovascular: Negative for chest pain and leg swelling.  Endocrine: Negative for cold intolerance.  Musculoskeletal: Positive for arthralgias and back pain.  Allergic/Immunologic: Negative for environmental allergies.  Psychiatric/Behavioral: The patient is nervous/anxious.     Past Medical History:  Diagnosis Date  . Anxiety   . Back pain   . Depression   . GERD (gastroesophageal reflux disease)     No past surgical history on file.  Family History  Problem Relation Age of Onset  . Heart disease Father   . Hypertension Father   . Cirrhosis Mother     Social History Social History  Substance Use Topics  . Smoking status: Current Every Day Smoker    Packs/day: 0.50    Years: 30.00    Types: Cigarettes  . Smokeless tobacco: Never Used  . Alcohol use No    No Known Allergies  Current Outpatient Prescriptions  Medication Sig Dispense Refill  . atorvastatin (LIPITOR) 10 MG tablet Take 1 tablet (10 mg total) by mouth daily. 90 tablet 1  . busPIRone (BUSPAR) 5 MG tablet Take 5 mg by mouth 3 (three) times daily.    Marland Kitchen HYDROcodone-acetaminophen (NORCO) 7.5-325 MG tablet One every four hours for pain as needed.  Do not drive car or  operate machinery while taking this medicine.  Must last 14 days. 56 tablet 0  . naproxen (NAPROSYN) 500 MG tablet Take 1 tablet (500 mg total) by mouth 2 (two) times daily with a meal. 60 tablet 5  . predniSONE (STERAPRED UNI-PAK 21 TAB) 5 MG (21) TBPK tablet Take 6 pills first day; 5 pills second day; 4 pills third day; 3 pills fourth day; 2 pills next day and 1 pill last day. 21 tablet 0  . tiZANidine (ZANAFLEX) 4 MG tablet One by mouth every 8 hours as needed for spasm 90 tablet 3   No current facility-administered medications for this visit.      Physical Exam  Blood pressure 136/86, pulse 72, height 6\' 1"  (1.854 m), weight 197 lb (89.4 kg).  Constitutional: overall normal hygiene, normal nutrition, well developed, normal grooming, normal body habitus. Assistive device:none  Musculoskeletal: gait and station Limp none, muscle tone and strength are normal, no tremors or atrophy is present.  .  Neurological: coordination overall normal.  Deep tendon reflex/nerve stretch intact.  Sensation normal.  Cranial nerves II-XII intact.   Skin:   Normal overall no scars, lesions, ulcers or rashes. No psoriasis.  Psychiatric: Alert and oriented x 3.  Recent memory intact, remote memory unclear.  Normal mood and affect. Well groomed.  Good eye contact.  Cardiovascular: overall no  swelling, no varicosities, no edema bilaterally, normal temperatures of the legs and arms, no clubbing, cyanosis and good capillary refill.  Lymphatic: palpation is normal.  Examination of left Upper Extremity is done.  Inspection:   Overall:  Elbow non-tender without crepitus or defects, forearm non-tender without crepitus or defects, wrist non-tender without crepitus or defects, hand non-tender.    Shoulder: with glenohumeral joint tenderness, without effusion.   Upper arm: without swelling and tenderness   Range of motion:   Overall:  Full range of motion of the elbow, full range of motion of wrist and full  range of motion in fingers.   Shoulder:  left  145 degrees forward flexion; 120 degrees abduction; 30 degrees internal rotation, 30 degrees external rotation, 15 degrees extension, 40 degrees adduction.   Stability:   Overall:  Shoulder, elbow and wrist stable   Strength and Tone:   Overall full shoulder muscles strength, full upper arm strength and normal upper arm bulk and tone.   The patient has been educated about the nature of the problem(s) and counseled on treatment options.  The patient appeared to understand what I have discussed and is in agreement with it.  Encounter Diagnoses  Name Primary?  . Left shoulder pain Yes  . Tobacco use disorder     PLAN Call if any problems.  Precautions discussed.  Continue current medications.   Return to clinic 3 weeks   Electronically Signed Darreld McleanWayne Bryn Perkin, MD 9/20/20172:00 PM

## 2016-06-10 NOTE — Patient Instructions (Signed)
Come out of sling  Shoulder Range of Motion Exercises Shoulder range of motion (ROM) exercises are designed to keep the shoulder moving freely. They are often recommended for people who have shoulder pain. MOVEMENT EXERCISE When you are able, do this exercise 5-6 days per week, or as told by your health care provider. Work toward doing 2 sets of 10 swings. Pendulum Exercise How To Do This Exercise Lying Down 1. Lie face-down on a bed with your abdomen close to the side of the bed. 2. Let your arm hang over the side of the bed. 3. Relax your shoulder, arm, and hand. 4. Slowly and gently swing your arm forward and back. Do not use your neck muscles to swing your arm. They should be relaxed. If you are struggling to swing your arm, have someone gently swing it for you. When you do this exercise for the first time, swing your arm at a 15 degree angle for 15 seconds, or swing your arm 10 times. As pain lessens over time, increase the angle of the swing to 30-45 degrees. 5. Repeat steps 1-4 with the other arm. How To Do This Exercise While Standing 1. Stand next to a sturdy chair or table and hold on to it with your hand.  Bend forward at the waist.  Bend your knees slightly.  Relax your other arm and let it hang limp.  Relax the shoulder blade of the arm that is hanging and let it drop.  While keeping your shoulder relaxed, use body motion to swing your arm in small circles. The first time you do this exercise, swing your arm for about 30 seconds or 10 times. When you do it next time, swing your arm for a little longer.  Stand up tall and relax.  Repeat steps 1-7, this time changing the direction of the circles. 2. Repeat steps 1-8 with the other arm. STRETCHING EXERCISES Do these exercises 3-4 times per day on 5-6 days per week or as told by your health care provider. Work toward holding the stretch for 20 seconds. Stretching Exercise 1 1. Lift your arm straight out in front of  you. 2. Bend your arm 90 degrees at the elbow (right angle) so your forearm goes across your body and looks like the letter "L." 3. Use your other arm to gently pull the elbow forward and across your body. 4. Repeat steps 1-3 with the other arm. Stretching Exercise 2 You will need a towel or rope for this exercise. 1. Bend one arm behind your back with the palm facing outward. 2. Hold a towel with your other hand. 3. Reach the arm that holds the towel above your head, and bend that arm at the elbow. Your wrist should be behind your neck. 4. Use your free hand to grab the free end of the towel. 5. With the higher hand, gently pull the towel up behind you. 6. With the lower hand, pull the towel down behind you. 7. Repeat steps 1-6 with the other arm. STRENGTHENING EXERCISES Do each of these exercises at four different times of day (sessions) every day or as told by your health care provider. To begin with, repeat each exercise 5 times (repetitions). Work toward doing 3 sets of 12 repetitions or as told by your health care provider. Strengthening Exercise 1 You will need a light weight for this activity. As you grow stronger, you may use a heavier weight. 1. Standing with a weight in your hand, lift your arm  straight out to the side until it is at the same height as your shoulder. 2. Bend your arm at 90 degrees so that your fingers are pointing to the ceiling. 3. Slowly raise your hand until your arm is straight up in the air. 4. Repeat steps 1-3 with the other arm. Strengthening Exercise 2 You will need a light weight for this activity. As you grow stronger, you may use a heavier weight. 1. Standing with a weight in your hand, gradually move your straight arm in an arc, starting at your side, then out in front of you, then straight up over your head. 2. Gradually move your other arm in an arc, starting at your side, then out in front of you, then straight up over your head. 3. Repeat steps 1-2  with the other arm. Strengthening Exercise 3 You will need an elastic band for this activity. As you grow stronger, gradually increase the size of the bands or increase the number of bands that you use at one time. 1. While standing, hold an elastic band in one hand and raise that arm up in the air. 2. With your other hand, pull down the band until that hand is by your side. 3. Repeat steps 1-2 with the other arm.   This information is not intended to replace advice given to you by your health care provider. Make sure you discuss any questions you have with your health care provider.   Document Released: 06/06/2003 Document Revised: 01/22/2015 Document Reviewed: 09/03/2014 Elsevier Interactive Patient Education [employer]2016 [employer]

## 2016-07-01 ENCOUNTER — Encounter: Payer: Self-pay | Admitting: Orthopaedic Surgery

## 2016-07-01 ENCOUNTER — Ambulatory Visit (INDEPENDENT_AMBULATORY_CARE_PROVIDER_SITE_OTHER): Payer: Self-pay | Admitting: Orthopaedic Surgery

## 2016-07-01 VITALS — BP 133/78 | HR 99 | Temp 97.9°F | Ht 73.0 in | Wt 198.0 lb

## 2016-07-01 DIAGNOSIS — F17200 Nicotine dependence, unspecified, uncomplicated: Secondary | ICD-10-CM

## 2016-07-01 DIAGNOSIS — M25512 Pain in left shoulder: Secondary | ICD-10-CM

## 2016-07-01 DIAGNOSIS — G8929 Other chronic pain: Secondary | ICD-10-CM

## 2016-07-01 MED ORDER — HYDROCODONE-ACETAMINOPHEN 7.5-325 MG PO TABS: ORAL_TABLET | ORAL | 0 refills | Status: AC

## 2016-07-01 NOTE — Patient Instructions (Signed)
Smoking Cessation, Tips for Success If you are ready to quit smoking, congratulations! You have chosen to help yourself be healthier. Cigarettes bring nicotine, tar, carbon monoxide, and other irritants into your body. Your lungs, heart, and blood vessels will be able to work better without these poisons. There are many different ways to quit smoking. Nicotine gum, nicotine patches, a nicotine inhaler, or nicotine nasal spray can help with physical craving. Hypnosis, support groups, and medicines help break the habit of smoking. WHAT THINGS CAN I DO TO MAKE QUITTING EASIER?  Here are some tips to help you quit for good:  Pick a date when you will quit smoking completely. Tell all of your friends and family about your plan to quit on that date.  Do not try to slowly cut down on the number of cigarettes you are smoking. Pick a quit date and quit smoking completely starting on that day.  Throw away all cigarettes.   Clean and remove all ashtrays from your home, work, and car.  On a card, write down your reasons for quitting. Carry the card with you and read it when you get the urge to smoke.  Cleanse your body of nicotine. Drink enough water and fluids to keep your urine clear or pale yellow. Do this after quitting to flush the nicotine from your body.  Learn to predict your moods. Do not let a bad situation be your excuse to have a cigarette. Some situations in your life might tempt you into wanting a cigarette.  Never have "just one" cigarette. It leads to wanting another and another. Remind yourself of your decision to quit.  Change habits associated with smoking. If you smoked while driving or when feeling stressed, try other activities to replace smoking. Stand up when drinking your coffee. Brush your teeth after eating. Sit in a different chair when you read the paper. Avoid alcohol while trying to quit, and try to drink fewer caffeinated beverages. Alcohol and caffeine may urge you to  smoke.  Avoid foods and drinks that can trigger a desire to smoke, such as sugary or spicy foods and alcohol.  Ask people who smoke not to smoke around you.  Have something planned to do right after eating or having a cup of coffee. For example, plan to take a walk or exercise.  Try a relaxation exercise to calm you down and decrease your stress. Remember, you may be tense and nervous for the first 2 weeks after you quit, but this will pass.  Find new activities to keep your hands busy. Play with a pen, coin, or rubber band. Doodle or draw things on paper.  Brush your teeth right after eating. This will help cut down on the craving for the taste of tobacco after meals. You can also try mouthwash.   Use oral substitutes in place of cigarettes. Try using lemon drops, carrots, cinnamon sticks, or chewing gum. Keep them handy so they are available when you have the urge to smoke.  When you have the urge to smoke, try deep breathing.  Designate your home as a nonsmoking area.  If you are a heavy smoker, ask your health care provider about a prescription for nicotine chewing gum. It can ease your withdrawal from nicotine.  Reward yourself. Set aside the cigarette money you save and buy yourself something nice.  Look for support from others. Join a support group or smoking cessation program. Ask someone at home or at work to help you with your plan   to quit smoking.  Always ask yourself, "Do I need this cigarette or is this just a reflex?" Tell yourself, "Today, I choose not to smoke," or "I do not want to smoke." You are reminding yourself of your decision to quit.  Do not replace cigarette smoking with electronic cigarettes (commonly called e-cigarettes). The safety of e-cigarettes is unknown, and some may contain harmful chemicals.  If you relapse, do not give up! Plan ahead and think about what you will do the next time you get the urge to smoke. HOW WILL I FEEL WHEN I QUIT SMOKING? You  may have symptoms of withdrawal because your body is used to nicotine (the addictive substance in cigarettes). You may crave cigarettes, be irritable, feel very hungry, cough often, get headaches, or have difficulty concentrating. The withdrawal symptoms are only temporary. They are strongest when you first quit but will go away within 10-14 days. When withdrawal symptoms occur, stay in control. Think about your reasons for quitting. Remind yourself that these are signs that your body is healing and getting used to being without cigarettes. Remember that withdrawal symptoms are easier to treat than the major diseases that smoking can cause.  Even after the withdrawal is over, expect periodic urges to smoke. However, these cravings are generally short lived and will go away whether you smoke or not. Do not smoke! WHAT RESOURCES ARE AVAILABLE TO HELP ME QUIT SMOKING? Your health care provider can direct you to community resources or hospitals for support, which may include:  Group support.  Education.  Hypnosis.  Therapy.   This information is not intended to replace advice given to you by your health care provider. Make sure you discuss any questions you have with your health care provider.   Document Released: 06/05/2004 Document Revised: 09/28/2014 Document Reviewed: 02/23/2013 Elsevier Interactive Patient Education 2016 Elsevier Inc.  

## 2016-07-01 NOTE — Progress Notes (Signed)
Patient ZO:XWRU:Jonathan Booth, male DOB:[birth year], 45 y.o. EAV:[id number]  Chief Complaint  Patient presents with  . Follow-up    left arm, right hip    HPI  Jonathan Booth is a 45 y.o. male who has chronic pain of the left shoulder.  The left shoulder is much improved.  He has full motion today. He has some right hip pain at times but no trauma. HPI  Body mass index is 26.12 kg/m.  ROS  Review of Systems  HENT: Negative for congestion.   Respiratory: Negative for cough and shortness of breath.   Cardiovascular: Negative for chest pain and leg swelling.  Endocrine: Negative for cold intolerance.  Musculoskeletal: Positive for arthralgias and back pain.  Allergic/Immunologic: Negative for environmental allergies.  Psychiatric/Behavioral: The patient is nervous/anxious.     Past Medical History:  Diagnosis Date  . Anxiety   . Back pain   . Depression   . GERD (gastroesophageal reflux disease)     No past surgical history on file.  Family History  Problem Relation Age of Onset  . Heart disease Father   . Hypertension Father   . Cirrhosis Mother     Social History Social History  Substance Use Topics  . Smoking status: Current Every Day Smoker    Packs/day: 0.50    Years: 30.00    Types: Cigarettes  . Smokeless tobacco: Never Used  . Alcohol use No    No Known Allergies  Current Outpatient Prescriptions  Medication Sig Dispense Refill  . atorvastatin (LIPITOR) 10 MG tablet Take 1 tablet (10 mg total) by mouth daily. 90 tablet 1  . busPIRone (BUSPAR) 5 MG tablet Take 5 mg by mouth 3 (three) times daily.    Marland Kitchen. HYDROcodone-acetaminophen (NORCO) 7.5-325 MG tablet One every four hours for pain as needed.  Do not drive car or operate machinery while taking this medicine.  Must last 14 days. 56 tablet 0  . naproxen (NAPROSYN) 500 MG tablet Take 1 tablet (500 mg total) by mouth 2 (two) times daily with a meal. 60 tablet 5  . tiZANidine (ZANAFLEX) 4 MG tablet One by mouth  every 8 hours as needed for spasm 90 tablet 3   No current facility-administered medications for this visit.      Physical Exam  Blood pressure 133/78, pulse 99, temperature 97.9 F (36.6 C), height 6\' 1"  (1.854 m), weight 198 lb (89.8 kg).  Constitutional: overall normal hygiene, normal nutrition, well developed, normal grooming, normal body habitus. Assistive device:none  Musculoskeletal: gait and station Limp none, muscle tone and strength are normal, no tremors or atrophy is present.  .  Neurological: coordination overall normal.  Deep tendon reflex/nerve stretch intact.  Sensation normal.  Cranial nerves II-XII intact.   Skin:   Normal overall no scars, lesions, ulcers or rashes. No psoriasis.  Psychiatric: Alert and oriented x 3.  Recent memory intact, remote memory unclear.  Normal mood and affect. Well groomed.  Good eye contact.  Cardiovascular: overall no swelling, no varicosities, no edema bilaterally, normal temperatures of the legs and arms, no clubbing, cyanosis and good capillary refill.  Lymphatic: palpation is normal.  He has full motion of the left shoulder, no pain ,no swelling, no crepitus.  NV intact.  The patient has been educated about the nature of the problem(s) and counseled on treatment options.  The patient appeared to understand what I have discussed and is in agreement with it.  Encounter Diagnoses  Name Primary?  . Chronic  left shoulder pain Yes  . Tobacco use disorder     PLAN Call if any problems.  Precautions discussed.  Continue current medications.   Return to clinic 1 month   Electronically Signed Darreld Mclean, MD 10/11/20172:57 PM

## 2016-07-02 ENCOUNTER — Ambulatory Visit: Payer: Self-pay | Admitting: Physician Assistant

## 2016-07-09 ENCOUNTER — Ambulatory Visit: Payer: Self-pay | Admitting: Physician Assistant

## 2016-07-14 ENCOUNTER — Ambulatory Visit: Payer: Self-pay | Admitting: Physician Assistant

## 2016-07-21 ENCOUNTER — Other Ambulatory Visit: Payer: Self-pay

## 2016-07-21 DIAGNOSIS — E785 Hyperlipidemia, unspecified: Secondary | ICD-10-CM

## 2016-07-23 ENCOUNTER — Ambulatory Visit: Payer: Self-pay | Admitting: Physician Assistant

## 2016-07-29 ENCOUNTER — Ambulatory Visit: Payer: Self-pay | Admitting: Orthopaedic Surgery

## 2016-07-30 ENCOUNTER — Encounter: Payer: Self-pay | Admitting: Physician Assistant

## 2017-01-15 ENCOUNTER — Encounter (HOSPITAL_COMMUNITY): Payer: Self-pay | Admitting: *Deleted

## 2017-01-15 ENCOUNTER — Emergency Department (HOSPITAL_COMMUNITY)
Admission: EM | Admit: 2017-01-15 | Discharge: 2017-01-15 | Disposition: A | Discharge status: Home / Self Care | Payer: Self-pay | Attending: Emergency Medicine | Admitting: Emergency Medicine

## 2017-01-15 DIAGNOSIS — G8929 Other chronic pain: Secondary | ICD-10-CM | POA: Insufficient documentation

## 2017-01-15 DIAGNOSIS — M545 Low back pain: Secondary | ICD-10-CM | POA: Insufficient documentation

## 2017-01-15 DIAGNOSIS — Z79899 Other long term (current) drug therapy: Secondary | ICD-10-CM | POA: Insufficient documentation

## 2017-01-15 DIAGNOSIS — F17210 Nicotine dependence, cigarettes, uncomplicated: Secondary | ICD-10-CM | POA: Insufficient documentation

## 2017-01-15 DIAGNOSIS — M549 Dorsalgia, unspecified: Secondary | ICD-10-CM

## 2017-01-15 MED ORDER — IBUPROFEN 600 MG PO TABS: 600.0000 mg | ORAL_TABLET | Freq: Four times a day (QID) | ORAL | 0 refills | Status: AC | PRN

## 2017-01-15 MED ORDER — CYCLOBENZAPRINE HCL 10 MG PO TABS: 10.0000 mg | ORAL_TABLET | Freq: Two times a day (BID) | ORAL | 0 refills | Status: AC | PRN

## 2017-01-15 NOTE — ED Triage Notes (Signed)
Pt comes in with lower back pain. This is chronic but got worse when he started a new job on Wednesday. Pt ambulatory with pain noted.

## 2017-01-15 NOTE — ED Triage Notes (Signed)
Back pain that has been for awhile per pt But injured it on a new job- 2 days ago  Has seen Dr Hilda Lias in the past

## 2017-01-15 NOTE — ED Provider Notes (Signed)
[facility] DEPT Provider Note   CSN: [id number] Arrival date & time: 01/15/17  1230     History   Chief Complaint Chief Complaint  Patient presents with  . Back Pain    HPI Jonathan Booth is a 46 y.o. male.  HPI   46 year old male with history of chronic back pain presenting today complaining of low back pain. Pt report he recently started a new job working for an [employer] a week ago.  For the past 2 days he has developed sharp pain going across his lower back and down to both thigh.  sts "my knees feel like it's going to give out".  Pain is moderate in intensity, worse with movement and improves with rest.  Denies any specific treatment tried.  Denies fever, chills, abd pain, dysuria, hematuria, bowel/bladder incontinence or saddle anesthesia. No recent injury, no weakness down his leg.  No hx of IVDU or active cancer.  Does have an orthopedist pt he no longer f/u because "he hasn't help me".    Past Medical History:  Diagnosis Date  . Anxiety   . Back pain   . Depression   . GERD (gastroesophageal reflux disease)     Patient Active Problem List   Diagnosis Date Noted  . Depression 01/31/2016  . Hyperlipidemia 09/29/2015  . Chronic back pain 09/29/2015  . Esophageal reflux 08/19/2015  . Cigarette nicotine dependence, uncomplicated 08/19/2015    No past surgical history on file.     Home Medications    Prior to Admission medications   Medication Sig Start Date End Date Taking? Authorizing Provider  atorvastatin (LIPITOR) 10 MG tablet Take 1 tablet (10 mg total) by mouth daily. 03/02/16   Jacquelin Hawking, PA-C  busPIRone (BUSPAR) 5 MG tablet Take 5 mg by mouth 3 (three) times daily.    Historical Provider, MD  HYDROcodone-acetaminophen (NORCO) 7.5-325 MG tablet One every four hours for pain as needed.  Do not drive car or operate machinery while taking this medicine.  Must last 14 days. 07/01/16   Darreld Mclean, MD  naproxen (NAPROSYN) 500 MG tablet Take 1  tablet (500 mg total) by mouth 2 (two) times daily with a meal. 04/15/16   Darreld Mclean, MD  tiZANidine (ZANAFLEX) 4 MG tablet One by mouth every 8 hours as needed for spasm 04/29/16   Darreld Mclean, MD    Family History Family History  Problem Relation Age of Onset  . Heart disease Father   . Hypertension Father   . Cirrhosis Mother     Social History Social History  Substance Use Topics  . Smoking status: Current Every Day Smoker    Packs/day: 0.50    Years: 30.00    Types: Cigarettes  . Smokeless tobacco: Never Used  . Alcohol use No     Allergies   Patient has no known allergies.   Review of Systems Review of Systems  All other systems reviewed and are negative.    Physical Exam Updated Vital Signs There were no vitals taken for this visit.  Physical Exam  Constitutional: He appears well-developed and well-nourished. No distress.  HENT:  Head: Atraumatic.  Eyes: Conjunctivae are normal.  Neck: Neck supple.  Cardiovascular: Intact distal pulses.   Abdominal: Soft. He exhibits no distension. There is no tenderness.  Musculoskeletal: He exhibits tenderness (tenderness to lumbar region including bilateral paralumbar spinal muscles on palpation.  no crepitus, no overlying skin changes. ).  Neurological: He is alert. He displays normal reflexes.  5 out 5 strength to bilateral lower extremities. No foot drops. Able to ambulate.  Skin: No rash noted.  Psychiatric: He has a normal mood and affect.  Nursing note and vitals reviewed.    ED Treatments / Results  Labs (all labs ordered are listed, but only abnormal results are displayed) Labs Reviewed - No data to display  EKG  EKG Interpretation None       Radiology No results found. ` Procedures Procedures (including critical care time)  Medications Ordered in ED Medications - No data to display   Initial Impression / Assessment and Plan / ED Course  I have reviewed the triage vital signs and the  nursing notes.  Pertinent labs & imaging results that were available during my care of the patient were reviewed by me and considered in my medical decision making (see chart for details).     BP (!) 142/69 (BP Location: Right Arm)   Pulse 60   Temp 97.9 F (36.6 C) (Oral)   Resp 18   Ht  (1.854 m)   Wt 91.6 kg   SpO2 100%   BMI 26.65 kg/m    Final Clinical Impressions(s) / ED Diagnoses   Final diagnoses:  Other chronic back pain    New Prescriptions Discharge Medication List as of 01/15/2017 12:50 PM    START taking these medications   Details  cyclobenzaprine (FLEXERIL) 10 MG tablet Take 1 tablet (10 mg total) by mouth 2 (two) times daily as needed for muscle spasms., Starting Fri 01/15/2017, Print    ibuprofen (ADVIL,MOTRIN) 600 MG tablet Take 1 tablet (600 mg total) by mouth every 6 (six) hours as needed., Starting Fri 01/15/2017, Print       12:55 PM Acute on chronic low back pain after starting a new job.  No red flags.  Able to ambulate.  He is NVI.  Will prescribe NSAIDs and Muscle relaxant.  Work note provided.     Fayrene Helper, PA-C 01/15/17 1256    Samuel Jester, DO 01/17/17 1122

## 2017-01-28 ENCOUNTER — Ambulatory Visit: Payer: Self-pay | Admitting: Orthopaedic Surgery

## 2017-01-29 IMAGING — MR MR LUMBAR SPINE W/O CM
4 of 5 series · 15 of 48 positions shown · non-contrast
Comparison: 03/05/2016 plain film exam.  No comparison MR.

CLINICAL DATA: 44-year-old male with increasing back pain over the
past 9 years from lifting injury. Limited mobility. Bilateral leg
pain with activity. Weakness and numbness. Subsequent encounter.

EXAM:
MRI LUMBAR SPINE WITHOUT CONTRAST
TECHNIQUE: Multiplanar, multisequence MR imaging of the lumbar spine was
performed. No intravenous contrast was administered.

[Series 3: T2 · sagittal · 4.0mm · 0.77mm/px · 6 of 17 slices shown (1 of 2)]
[im 1/17]
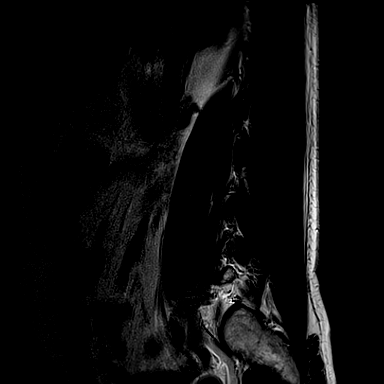
[im 4/17]
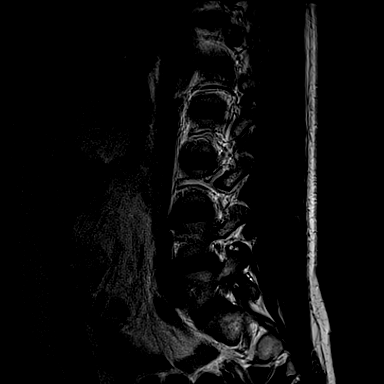
[im 7/17]
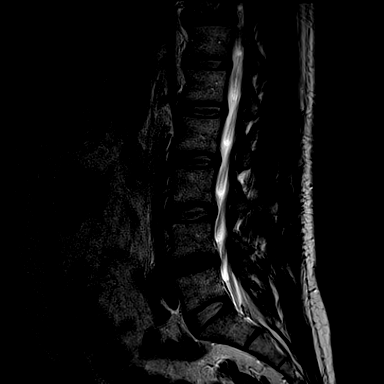
[im 10/17]
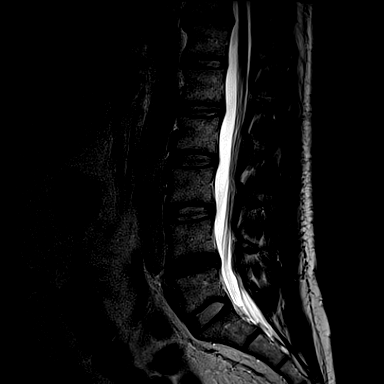
[im 13/17]
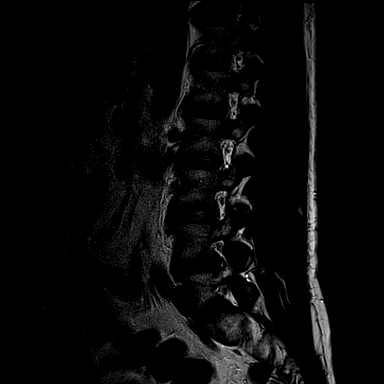
[im 17/17]
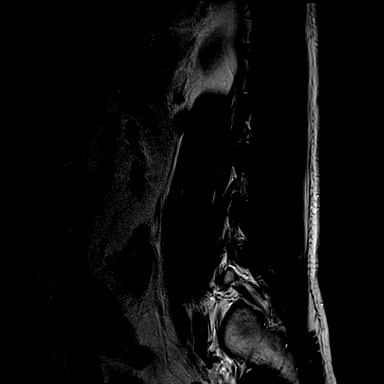

[Series 4: T1 · sagittal · 4.0mm · 0.39mm/px · 3 of 17 slices shown (1 of 2)]
[im 4/17]
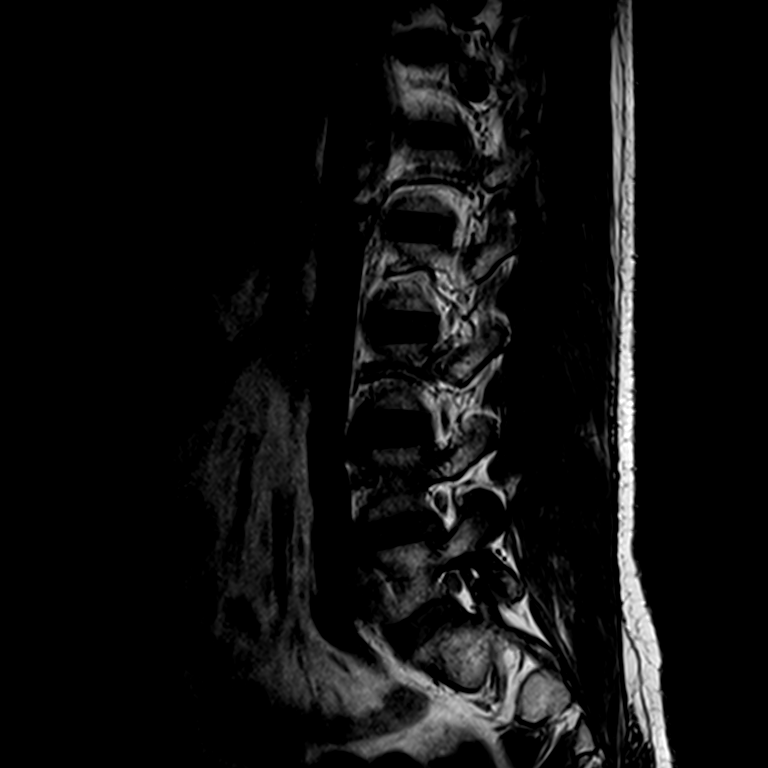
[im 10/17]
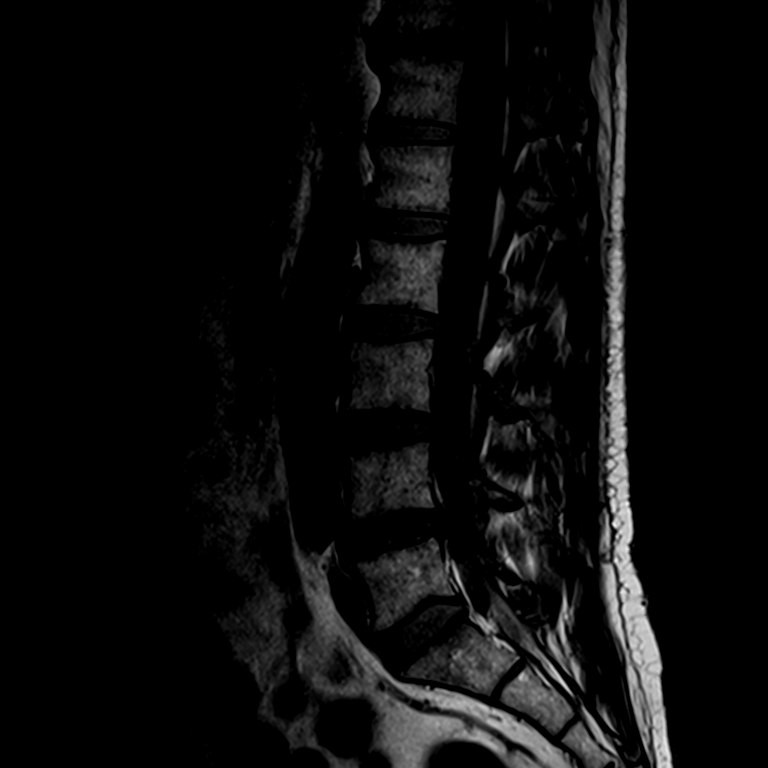
[im 17/17]
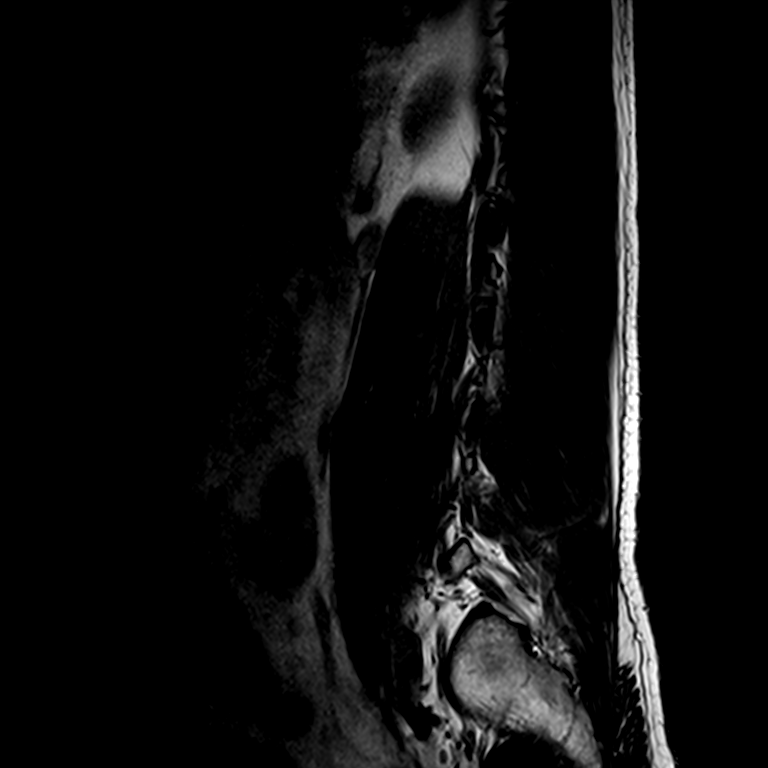

[Series 6: T2 · axial · 4.0mm · 0.22mm/px · z∈[-132,+18]mm · 3 of 45 slices shown (2 of 2)]
[im 7/45]
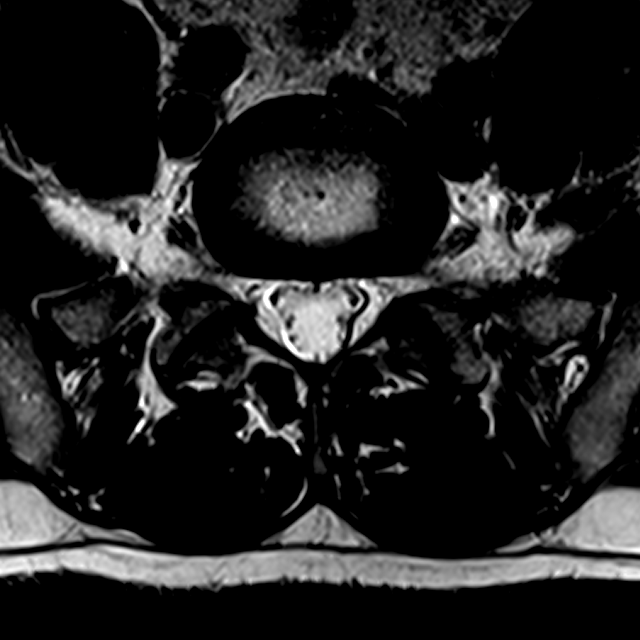
[im 23/45]
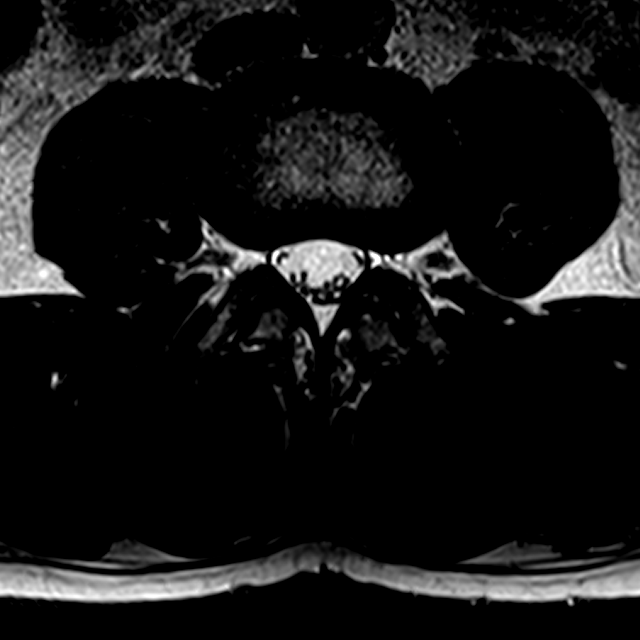
[im 38/45]
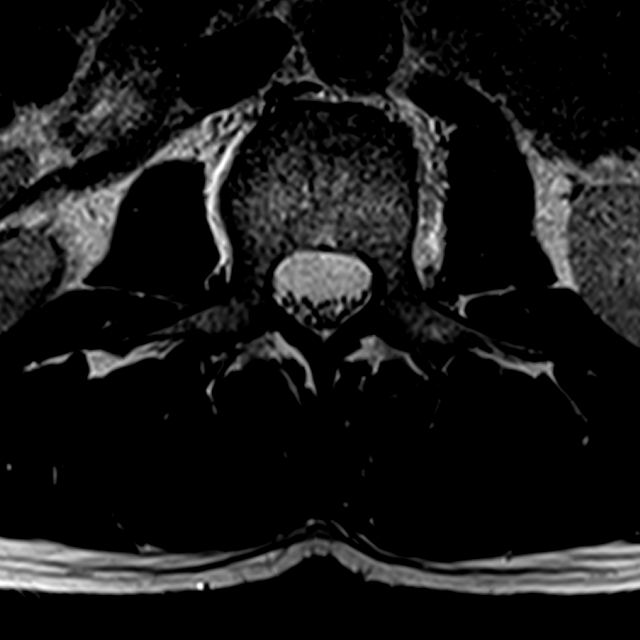

[Series 7: T1 · axial · 4.0mm · 0.24mm/px · z∈[-134,+19]mm · 3 of 45 slices shown (2 of 2)]
[im 7/45]
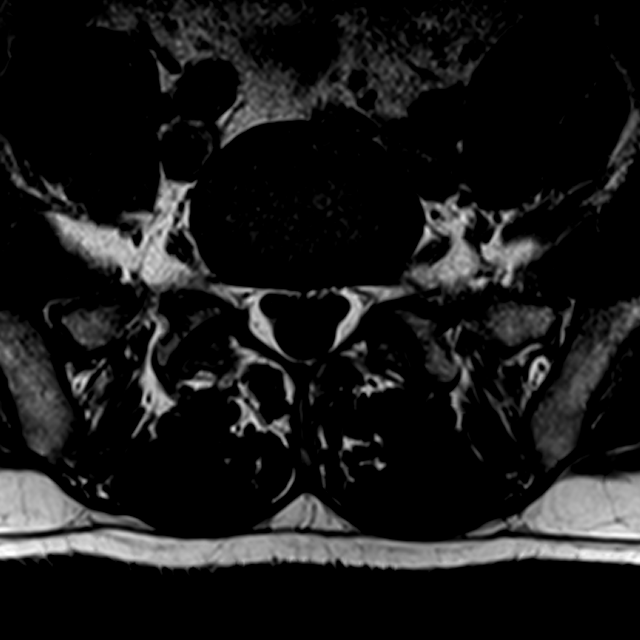
[im 23/45]
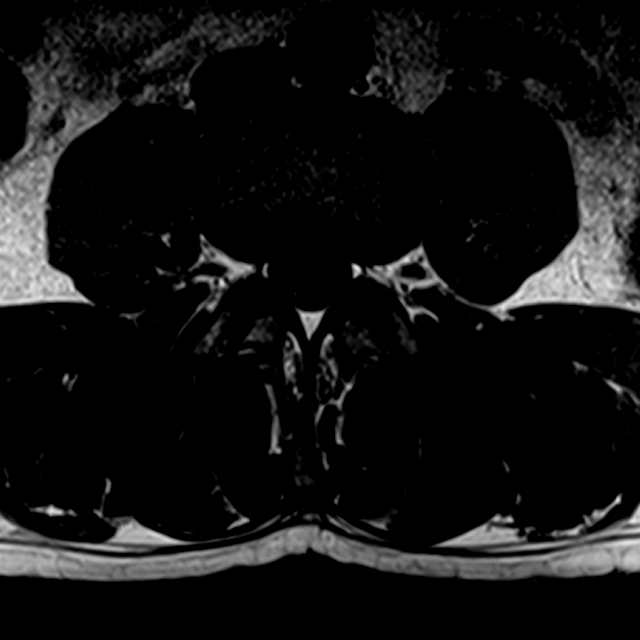
[im 38/45]
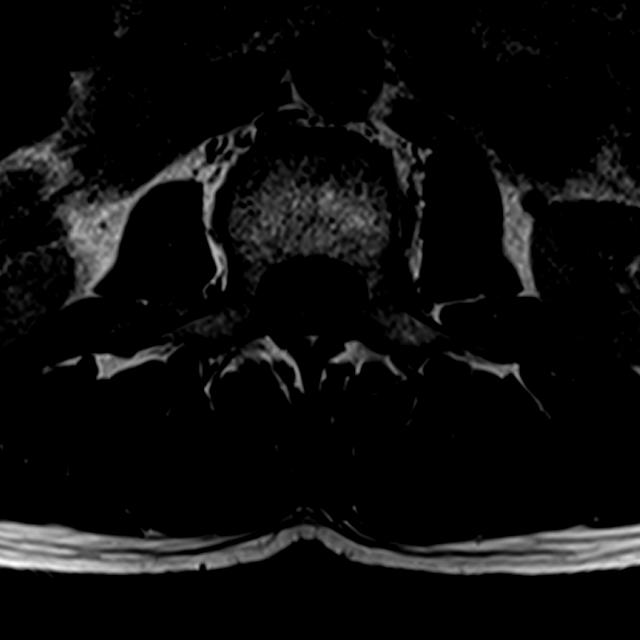

[15 of 48 positions shown; findings below may reference images not displayed]

FINDINGS: Segmentation: Last fully open disk space is labeled L5-S1. Present
examination incorporates from T11-12 disc space through the lower S3
level.

Alignment:  Straightening of the lumbar spine.

Vertebrae:  No worrisome abnormality.

Conus medullaris: Extends to the L1 level and appears normal.

Paraspinal and other soft tissues: No worrisome abnormality.

Disc levels:

T11-12: Disc degeneration. Bulge with mild narrowing ventral thecal
sac.

T12-L1:  Negative.

L1-2:  Negative.

L2-3:  Negative.

L3-4:  Minimal facet degenerative changes.

L4-5: Mild disc degeneration. Bulge with minimal caudal extension.
Tiny annular fissure. Mild facet degenerative changes. No
significant spinal stenosis or foraminal narrowing.

L5-S1:  Mild facet bony overgrowth.
IMPRESSION: Mild degenerative changes L4-5 and T11-12 without significant spinal
stenosis or foraminal narrowing as detailed above.

## 2017-04-14 ENCOUNTER — Telehealth: Payer: Self-pay

## 2017-04-14 NOTE — Congregational Nurse Program (Signed)
Congregational Nurse Program Note  Date of Encounter: 04/13/2017  Past Medical History: Past Medical History:  Diagnosis Date  . Anxiety   . Back pain   . Depression   . GERD (gastroesophageal reflux disease)     Encounter Details:     CNP Questionnaire - 04/13/17 0843      Patient Demographics   Is this a new or existing patient? New   Patient is considered a/an Not Applicable   Race Caucasian/White     Patient Assistance   Location of Patient [place]   Patient's financial/insurance status Low Income;Self-Pay (Uninsured)   Uninsured Patient ([place]) Yes   Interventions Counseled to make appt. with provider;Assisted patient in making appt.   Patient referred to apply for the following financial assistance Not Applicable   Food insecurities addressed Not Applicable   Transportation assistance No   Assistance securing medications No   Educational health offerings Chronic disease;Navigating the healthcare system     Encounter Details   Primary purpose of visit Chronic Illness/Condition Visit;Education/Health Concerns;Navigating the Healthcare System   Was an Emergency Department visit averted? Not Applicable   Does patient have a medical provider? No   Patient referred to [place] PCP   Was a mental health screening completed? (GAINS tool) No   Does patient have dental issues? No   Does patient have vision issues? No   Does your patient have an abnormal blood pressure today? No   Since previous encounter, have you referred patient for abnormal blood pressure that resulted in a new diagnosis or medication change? No   Does your patient have an abnormal blood glucose today? No   Since previous encounter, have you referred patient for abnormal blood glucose that resulted in a new diagnosis or medication change? No   Was there a life-saving intervention made? No      New Client to [facility] center, but familiar to RN and [place] program. RN had met with client previously at the [facility] a year or so ago and was referred into the [facility]  Today client states he still has lower back pain and that he recently went to [facility] and was given an MRI that showed bulging discs and that he "needs a specialist". I discussed with client that a referral to a specialist would have to come from a primary care provider and that I could not make that referral. Discussed with him options for primary care providers since he states [facility] nor did he return to Dr. Luna Glasgow that the [facility] referred him to. He relates that he felt he was not getting any better. He also states that the Dr. Luna Glasgow he thought had ordered the "wrong MRI" so he became upset and quit therapy and did not go back to the [facility] or Dr. Luna Glasgow.  Alert to person and place, answers questions appropriately. Complains of lower back pain that hurts with sitting, standing, walking. Discussed with client his options for primary care. Currently [facility] is not accepting new patients, therefore client wants to be referred back into the free clinic for primary medical care.  Referral made and appointment secured for 04/22/17, appointment date and time given to client.  Will follow as needed.

## 2017-04-14 NOTE — Telephone Encounter (Signed)
Client returned phone message. Spoke with client regarding information that the [facility] is now accepting new patients if he would like to call and set up a screening to become a new patient there. Client given name and contact information of Carla at [phone] to set up a screening. Client states he will let me know if he decides to become a patient there so I can cancel his free clinic appointment.    Will follow as needed.

## 2017-04-14 NOTE — Telephone Encounter (Signed)
Left message for patient to return call to discuss medical options.

## 2017-04-21 ENCOUNTER — Ambulatory Visit: Payer: Self-pay | Admitting: Physician Assistant

## 2018-04-07 ENCOUNTER — Encounter: Payer: Self-pay | Admitting: Orthopaedic Surgery

## 2018-06-01 ENCOUNTER — Emergency Department (HOSPITAL_COMMUNITY)
Admission: EM | Admit: 2018-06-01 | Discharge: 2018-06-01 | Disposition: A | Discharge status: Home / Self Care | Payer: Self-pay | Attending: Emergency Medicine | Admitting: Emergency Medicine

## 2018-06-01 ENCOUNTER — Encounter (HOSPITAL_COMMUNITY): Payer: Self-pay | Admitting: Emergency Medicine

## 2018-06-01 DIAGNOSIS — M5441 Lumbago with sciatica, right side: Secondary | ICD-10-CM | POA: Insufficient documentation

## 2018-06-01 DIAGNOSIS — F17210 Nicotine dependence, cigarettes, uncomplicated: Secondary | ICD-10-CM | POA: Insufficient documentation

## 2018-06-01 DIAGNOSIS — Z79899 Other long term (current) drug therapy: Secondary | ICD-10-CM | POA: Insufficient documentation

## 2018-06-01 MED ORDER — PREDNISONE 20 MG PO TABS: 40.0000 mg | ORAL_TABLET | Freq: Every day | ORAL | 0 refills | Status: AC

## 2018-06-01 MED ORDER — IBUPROFEN 400 MG PO TABS
600.0000 mg | ORAL_TABLET | Freq: Once | ORAL | Status: AC
  Administered 2018-06-01: 600 mg via ORAL
  Filled 2018-06-01: qty 1

## 2018-06-01 MED ORDER — LIDOCAINE 5 % EX PTCH: 1.0000 | MEDICATED_PATCH | CUTANEOUS | 0 refills | Status: AC

## 2018-06-01 MED ORDER — METHOCARBAMOL 500 MG PO TABS: 500.0000 mg | ORAL_TABLET | Freq: Every evening | ORAL | 0 refills | Status: AC | PRN

## 2018-06-01 MED ORDER — NAPROXEN 500 MG PO TABS: 500.0000 mg | ORAL_TABLET | Freq: Two times a day (BID) | ORAL | 0 refills | Status: AC

## 2018-06-01 MED ORDER — METHOCARBAMOL 500 MG PO TABS
500.0000 mg | ORAL_TABLET | Freq: Once | ORAL | Status: AC
  Administered 2018-06-01: 500 mg via ORAL
  Filled 2018-06-01: qty 1

## 2018-06-01 NOTE — ED Notes (Signed)
Discharge instructions reviewed by Aundra Millet, m RN. Pt verbalized understanding of d/c instructions. Pt denied any further requests.

## 2018-06-01 NOTE — Discharge Instructions (Signed)
You were seen here today for Back Pain: Low back pain is discomfort in the lower back that may be due to injuries to muscles and ligaments around the spine. Occasionally, it may be caused by a problem to a part of the spine called a disc. Your back pain should be treated with medicines listed below as well as back exercises and this back pain should get better over the next 2 weeks. Most patients get completely well in 4 weeks. It is important to know however, if you develop severe or worsening pain, low back pain with fever, numbness, weakness or inability to walk or urinate, you should return to the ER immediately.  Please follow up with your doctor this week for a recheck if still having symptoms.  HOME INSTRUCTIONS Self - care:  The application of heat can help soothe the pain.  Maintaining your daily activities, including walking (this is encouraged), as it will help you get better faster than just staying in bed. Do not life, push, pull anything more than 10 pounds for the next week. I am attaching back exercises that you can do at home to help facilitate your recovery.   Back Exercises - I have attached a handout on back exercises that can be done at home to help facilitate your recovery.   Medications are also useful to help with pain control.   Acetaminophen.  This medication is generally safe, and found over the counter. Take as directed for your age. You should not take more than 8 of the extra strength (500mg ) pills a day (max dose is 4000mg  total OVER one day)  Non steroidal anti inflammatory: This includes medications including Ibuprofen, naproxen and Mobic; These medications help both pain and swelling and are very useful in treating back pain.  They should be taken with food, as they can cause stomach upset, and more seriously, stomach bleeding. Do not combine the medications.   Muscle relaxants:  These medications can help with muscle tightness that is a cause of lower back pain.  Most  of these medications can cause drowsiness, and it is not safe to drive or use dangerous machinery while taking them. They are primarily helpful when taken at night before sleep.  Prednisone - Prednisone can be used for low back pain when nerves such as the sciatic nerve are though to be involved. This medication will help decrease the inflammation around the nerve and help facilitate faster recovery. Prednisone - This is an oral steroid.  This medication is best taken with food in the morning.  Please note that this medication can cause anxiety, mood swings, muscle fatigue, increased hunger, weight gain (sodium/fluid retention), poor sleep as well as other symptoms. If you are a diabetic, please monitor your blood sugars at home as this medication can increase your blood sugars. Call your pharmacist if you have any questions.  You will need to follow up with your primary healthcare provider  or the Orthopedist in 1-2 weeks for reassessment and persistent symptoms.  Be aware that if you develop new symptoms, such as a fever, leg weakness, difficulty with or loss of control of your urine or bowels, abdominal pain, or more severe pain, you will need to seek medical attention and/or return to the Emergency department. Additional Information:  Your vital signs today were: BP 134/76 (BP Location: Right Arm)    Pulse 80    Temp 98.6 F (37 C) (Oral)    Resp 18    SpO2 97%  If your blood pressure (BP) was elevated above 135/85 this visit, please have this repeated by your doctor within one month. ---------------

## 2018-06-01 NOTE — ED Provider Notes (Signed)
[facility] EMERGENCY DEPARTMENT Provider Note   CSN: [id number] Arrival date & time: 06/01/18  1521     History   Chief Complaint Chief Complaint  Patient presents with  . Back Pain    HPI Jonathan Booth is a 47 y.o. male history of chronic back pain who presents emergency department today for back pain.  Patient reports that he has had back pain for approximately 8 years that he recently was told is secondary to herniated disc.  Chart review the patient had an MRI of the lumbar spine on 04/28/2016 that shows mild disc degeneration of L4-5 with bulge, as well as disc degeneration and bulge of T11-12.  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  He notes that his pain is throbbing, 8/10, constant and worsened when bending over, ambulating and picking things up.  He reports that his pain does radiate down his right leg on occasion.  He has been taking Tylenol for his symptoms without any relief.  Reports he has never seen an orthopedist for this. Denies history of cancer, trauma, fever, night pain, IV drug use, recent spinal manipulation or procedures, upper back pain or neck pain, numbness/tingling/weakness of the lower extremities, urinary retention, loss of bowel/bladder function, saddle anesthesia, or unexplained weight loss. Denies dysuria, flank pain, suprapubic pain, frequency, urgency, or hematuria.   HPI  Past Medical History:  Diagnosis Date  . Anxiety   . Back pain   . Depression   . GERD (gastroesophageal reflux disease)     Patient Active Problem List   Diagnosis Date Noted  . Depression 01/31/2016  . Hyperlipidemia 09/29/2015  . Chronic back pain 09/29/2015  . Esophageal reflux 08/19/2015  . Cigarette nicotine dependence, uncomplicated 08/19/2015    History reviewed. No pertinent surgical history.      Home Medications    Prior to Admission medications    Medication Sig Start Date End Date Taking? Authorizing Provider  atorvastatin (LIPITOR) 10 MG tablet Take 1 tablet (10 mg total) by mouth daily. 03/02/16   Jacquelin Hawking, PA-C  busPIRone (BUSPAR) 5 MG tablet Take 5 mg by mouth 3 (three) times daily.    [provider]  cyclobenzaprine (FLEXERIL) 10 MG tablet Take 1 tablet (10 mg total) by mouth 2 (two) times daily as needed for muscle spasms. 01/15/17   Fayrene Helper, PA-C  HYDROcodone-acetaminophen (NORCO) 7.5-325 MG tablet One every four hours for pain as needed.  Do not drive car or operate machinery while taking this medicine.  Must last 14 days. 07/01/16   Darreld Mclean, MD  ibuprofen (ADVIL,MOTRIN) 600 MG tablet Take 1 tablet (600 mg total) by mouth every 6 (six) hours as needed. 01/15/17   Fayrene Helper, PA-C  naproxen (NAPROSYN) 500 MG tablet Take 1 tablet (500 mg total) by mouth 2 (two) times daily with a meal. 04/15/16   Darreld Mclean, MD  tiZANidine (ZANAFLEX) 4 MG tablet One by mouth every 8 hours as needed for spasm 04/29/16   Darreld Mclean, MD    Family History Family History  Problem Relation Age of Onset  . Heart disease Father   . Hypertension Father   . Cirrhosis Mother     Social History Social History   Tobacco Use  . Smoking status: Current Every Day Smoker    Packs/day: 0.50    Years: 30.00    Pack years: 15.00  Types: Cigarettes  . Smokeless tobacco: Never Used  Substance Use Topics  . Alcohol use: No  . Drug use: No     Allergies   Patient has no known allergies.   Review of Systems Review of Systems  All other systems reviewed and are negative.    Physical Exam Updated Vital Signs BP 134/76 (BP Location: Right Arm)   Pulse 80   Temp 98.6 F (37 C) (Oral)   Resp 18   SpO2 97%   Physical Exam  Constitutional: He appears well-developed and well-nourished. No distress.  Non-toxic appearing  HENT:  Head: Normocephalic and atraumatic.  Right Ear: External ear normal.  Left Ear:  External ear normal.  Neck: Normal range of motion. Neck supple. No spinous process tenderness present. No neck rigidity. Normal range of motion present.  Cardiovascular: Normal rate, regular rhythm, normal heart sounds and intact distal pulses.  No murmur heard. Pulses:      Radial pulses are 2+ on the right side, and 2+ on the left side.       Femoral pulses are 2+ on the right side, and 2+ on the left side.      Dorsalis pedis pulses are 2+ on the right side, and 2+ on the left side.       Posterior tibial pulses are 2+ on the right side, and 2+ on the left side.  Pulmonary/Chest: Effort normal and breath sounds normal. No respiratory distress.  Abdominal: Soft. Bowel sounds are normal. He exhibits no pulsatile midline mass. There is no tenderness. There is no rigidity, no rebound and no CVA tenderness.  Musculoskeletal:  Posterior and appearance appears normal. No evidence of obvious scoliosis or kyphosis. No obvious signs of skin changes, trauma, deformity, infection. No C, T, or L spine tenderness or step-offs to palpation. No C, T paraspinal tenderness. Right lumbar paraspinal ttp. Lung expansion normal. Bilateral lower extremity strength 5 out of 5 including extensor hallucis longus. Patellar and Achilles deep tendon reflex 2+ and equal bilaterally. Sensation of lower extremities grossly intact. Gait able but patient notes painful. Lower extremity compartments soft. PT and DP 2+ b/l. Cap refill <2 seconds.   Neurological: He is alert.  No foot drop  Skin: Skin is warm, dry and intact. Capillary refill takes less than 2 seconds. No rash noted. He is not diaphoretic. No erythema.  No vesicular-like rash noted.  Nursing note and vitals reviewed.    ED Treatments / Results  Labs (all labs ordered are listed, but only abnormal results are displayed) Labs Reviewed - No data to display  EKG None  Radiology No results found.  Procedures Procedures (including critical care  time)  Medications Ordered in ED Medications  ibuprofen (ADVIL,MOTRIN) tablet 600 mg (has no administration in time range)  methocarbamol (ROBAXIN) tablet 500 mg (has no administration in time range)     Initial Impression / Assessment and Plan / ED Course  I have reviewed the triage vital signs and the nursing notes.  Pertinent labs & imaging results that were available during my care of the patient were reviewed by me and considered in my medical decision making (see chart for details).     47 y.o. male with a history of chronic back pain. Reports aggravation after starting a new job. Previous records and images revived. No neurologic deficits and normal neuro exam.  Patient can walk but states it is painful.  No bowel or bladder incontinence.  No urinary retention or saddle anesthesia.  No concern for cauda equina.  No history of trauma, personal history of cancer, night sweats or weight loss that would warrant a x-ray at this time.  No spinal injections, fever or IV drug use that make me concerned for spinal hematoma or abscess.  No pulsatile mass of the abdomen.  No abdominal tenderness to palpation or guarding to make me concern for intra-abdominal pathology.  No urinary symptoms or CVA tenderness to make me concerned for cystitis versus pyelonephritis versus kidney stone.  Suspect patient's symptoms are musculoskeletal in nature. Will treat the patient with back exercises, activity modification, muscle relaxers, NSAIDs (no history of kidney disease or GI bleed).  Will also give steroids.  Patient is to follow-up with PCP versus orthopedics. Discussed he may benefit from physical therapy. Strict return precautions discussed.  Patient appears safe for discharge.  Final Clinical Impressions(s) / ED Diagnoses   Final diagnoses:  Acute right-sided low back pain with right-sided sciatica    ED Discharge Orders         Ordered    predniSONE (DELTASONE) 20 MG tablet  Daily with breakfast      06/01/18 1757    naproxen (NAPROSYN) 500 MG tablet  2 times daily     06/01/18 1757    methocarbamol (ROBAXIN) 500 MG tablet  At bedtime PRN     06/01/18 1757    lidocaine (LIDODERM) 5 %  Every 24 hours     06/01/18 1757           Princella Pellegrini 06/01/18 Audrea Muscat, MD 06/03/18 1212

## 2018-06-01 NOTE — ED Triage Notes (Signed)
Pt presents to ED for assessment of chronic lower back pain from a "herniated disc" which patient states he was diagnosed with 6 years ago.  Patient states he has been in and out of work due to not being able to work due to the pain.  Patient states "I've been to multiple hospitals, and no one can help me".

## 2021-04-15 ENCOUNTER — Telehealth: Payer: Self-pay

## 2021-04-15 NOTE — Telephone Encounter (Signed)
Called to follow up with client. No answer, left voicemail. Client established with [facility]. Next appointment 04/29/21 at 1:30P.   Francee Nodal RN [employer]

## 2021-10-08 ENCOUNTER — Telehealth: Payer: Self-pay

## 2021-10-08 NOTE — Telephone Encounter (Signed)
Transportation arranged for client to go to [facility] spinal center in [place] on 10/20/21 at 9am.  Attempted to call client to update and no answer. Left voicemail requesting return call.  Francee Nodal RN [facility]

## 2022-10-14 ENCOUNTER — Telehealth: Payer: Self-pay

## 2022-10-14 NOTE — Telephone Encounter (Signed)
Attempted to call for follow up today of [facility] client who has been seen at [facility]. Last seen 04/20/22, no upcoming appointments scheduled at this time.  No answer, left Voicemail requesting return call.   [place] [employer]
